# Patient Record
Sex: Male | Born: 1960 | Race: White | Hispanic: No | Marital: Married | Smoking: Former smoker
Health system: Southern US, Community
[De-identification: ages and names within clinical notes are randomized; demographics above are authoritative.]

## PROBLEM LIST (undated history)

## (undated) DIAGNOSIS — T7840XA Allergy, unspecified, initial encounter: Secondary | ICD-10-CM

## (undated) DIAGNOSIS — M5126 Other intervertebral disc displacement, lumbar region: Secondary | ICD-10-CM

## (undated) DIAGNOSIS — M48061 Spinal stenosis, lumbar region without neurogenic claudication: Secondary | ICD-10-CM

## (undated) DIAGNOSIS — M545 Low back pain, unspecified: Secondary | ICD-10-CM

## (undated) DIAGNOSIS — K219 Gastro-esophageal reflux disease without esophagitis: Secondary | ICD-10-CM

## (undated) DIAGNOSIS — M4316 Spondylolisthesis, lumbar region: Secondary | ICD-10-CM

## (undated) DIAGNOSIS — E785 Hyperlipidemia, unspecified: Secondary | ICD-10-CM

## (undated) HISTORY — DX: Gastro-esophageal reflux disease without esophagitis: K21.9

## (undated) HISTORY — DX: Hyperlipidemia, unspecified: E78.5

## (undated) HISTORY — DX: Low back pain, unspecified: M54.50

## (undated) HISTORY — DX: Spinal stenosis, lumbar region without neurogenic claudication: M48.061

## (undated) HISTORY — DX: Low back pain: M54.5

## (undated) HISTORY — PX: SPINE SURGERY: SHX786

## (undated) HISTORY — DX: Allergy, unspecified, initial encounter: T78.40XA

## (undated) HISTORY — DX: Spondylolisthesis, lumbar region: M43.16

## (undated) HISTORY — PX: VASECTOMY: SHX75

## (undated) HISTORY — DX: Other intervertebral disc displacement, lumbar region: M51.26

---

## 2012-07-03 LAB — HM COLONOSCOPY

## 2014-03-10 LAB — LIPID PANEL
Cholesterol: 271 — AB (ref 0–200)
LDL Cholesterol: 169
Triglycerides: 287 — AB (ref 40–160)

## 2014-03-10 LAB — BASIC METABOLIC PANEL: Glucose: 103

## 2014-07-02 LAB — BASIC METABOLIC PANEL: Glucose: 102

## 2014-07-02 LAB — LIPID PANEL
Cholesterol: 186 (ref 0–200)
HDL: 42 (ref 35–70)
HDL: 42 (ref 35–70)
LDL Cholesterol: 111
LDL Cholesterol: 111
Triglycerides: 167 — AB (ref 40–160)
Triglycerides: 167 — AB (ref 40–160)

## 2014-07-02 LAB — HEMOGLOBIN A1C: Hemoglobin A1C: 5.6

## 2015-01-21 LAB — HEMOGLOBIN A1C: Hemoglobin A1C: 5.8

## 2015-01-21 LAB — LIPID PANEL
Cholesterol: 180 (ref 0–200)
HDL: 42 (ref 35–70)
LDL Cholesterol: 98
Triglycerides: 198 — AB (ref 40–160)

## 2015-01-21 LAB — TSH: TSH: 1.71 (ref 0.41–5.90)

## 2016-10-09 ENCOUNTER — Ambulatory Visit: Payer: Self-pay | Admitting: Family Medicine

## 2016-10-10 ENCOUNTER — Ambulatory Visit: Payer: Self-pay | Admitting: Family Medicine

## 2016-10-19 ENCOUNTER — Encounter: Payer: Self-pay | Admitting: Family Medicine

## 2016-10-19 LAB — CHG ASSAY OF BLOOD LIPOPROTEIN,VLDL CHOLEST
VLDL Cholesterol Cal: 33
VLDL Cholesterol Cal: 33
VLDL Cholesterol Cal: 40

## 2016-10-19 LAB — VITAMIN D 25 HYDROXY (VIT D DEFICIENCY, FRACTURES)
Vitamin D, 25-OH, Total: 20.9
Vitamin D, 25-OH, Total: 30.3

## 2016-10-31 ENCOUNTER — Encounter: Payer: Self-pay | Admitting: Family Medicine

## 2016-10-31 ENCOUNTER — Ambulatory Visit (INDEPENDENT_AMBULATORY_CARE_PROVIDER_SITE_OTHER): Payer: BLUE CROSS/BLUE SHIELD | Admitting: Family Medicine

## 2016-10-31 VITALS — BP 136/98 | HR 80 | Temp 98.0°F | Ht 66.25 in | Wt 169.2 lb

## 2016-10-31 DIAGNOSIS — R030 Elevated blood-pressure reading, without diagnosis of hypertension: Secondary | ICD-10-CM | POA: Diagnosis not present

## 2016-10-31 DIAGNOSIS — F17200 Nicotine dependence, unspecified, uncomplicated: Secondary | ICD-10-CM

## 2016-10-31 DIAGNOSIS — K219 Gastro-esophageal reflux disease without esophagitis: Secondary | ICD-10-CM | POA: Diagnosis not present

## 2016-10-31 DIAGNOSIS — E785 Hyperlipidemia, unspecified: Secondary | ICD-10-CM | POA: Diagnosis not present

## 2016-10-31 DIAGNOSIS — J301 Allergic rhinitis due to pollen: Secondary | ICD-10-CM

## 2016-10-31 DIAGNOSIS — M545 Low back pain, unspecified: Secondary | ICD-10-CM | POA: Insufficient documentation

## 2016-10-31 DIAGNOSIS — Z860100 Personal history of colon polyps, unspecified: Secondary | ICD-10-CM | POA: Insufficient documentation

## 2016-10-31 DIAGNOSIS — E559 Vitamin D deficiency, unspecified: Secondary | ICD-10-CM | POA: Diagnosis not present

## 2016-10-31 DIAGNOSIS — G8929 Other chronic pain: Secondary | ICD-10-CM

## 2016-10-31 DIAGNOSIS — Z87891 Personal history of nicotine dependence: Secondary | ICD-10-CM | POA: Insufficient documentation

## 2016-10-31 DIAGNOSIS — J309 Allergic rhinitis, unspecified: Secondary | ICD-10-CM | POA: Insufficient documentation

## 2016-10-31 MED ORDER — VARENICLINE TARTRATE 0.5 MG PO TABS: 0.5000 mg | ORAL_TABLET | Freq: Two times a day (BID) | ORAL | 0 refills | Status: DC

## 2016-10-31 MED ORDER — PRAVASTATIN SODIUM 40 MG PO TABS: 40.0000 mg | ORAL_TABLET | Freq: Every day | ORAL | 3 refills | Status: DC

## 2016-10-31 MED ORDER — VARENICLINE TARTRATE 1 MG PO TABS: 1.0000 mg | ORAL_TABLET | Freq: Two times a day (BID) | ORAL | 5 refills | Status: DC

## 2016-10-31 NOTE — Assessment & Plan Note (Signed)
S: takes vitamin D 2000 units vitamin D a day. January 2017 was around [facility] A/P: we will update vitamin D with CPe

## 2016-10-31 NOTE — Assessment & Plan Note (Addendum)
Quit cold [place] in 20s for $100. Patient about 1 pack pre week. Has quit on chantix in past for 3 months. Wants to use 6 month course- very reasonable

## 2016-10-31 NOTE — Assessment & Plan Note (Signed)
S: patient takes OTC prilosec for help with GERD- seems to be effective and uses sparingly A/P: we discussed some of associations with PPI use and agreed prn use ideal

## 2016-10-31 NOTE — Assessment & Plan Note (Signed)
S: claritin in spring when mows lawn A/P: very reasonable,c ontinue prn use antihistamines

## 2016-10-31 NOTE — Assessment & Plan Note (Signed)
S: appears to have been controlled on last check on pravastatin 40mg . No myalgias.  Lab Results  Component Value Date   CHOL 180 01/21/2015   HDL 42 01/21/2015   LDLCALC 98 01/21/2015   TRIG 198 (A) 01/21/2015   A/P: will update lipids pre physical- ordered today

## 2016-10-31 NOTE — Patient Instructions (Addendum)
Blood pressure goal <140/90. High today BP Readings from Last 3 Encounters:  10/31/16 (!) 136/98  Dash eating plan can help- see below  For chantix-  Take one 0.5 mg tablet by mouth once daily for 3 days, then increase to one 0.5 mg tablet twice daily for 4 days, then increase to two 0.5 mg tablet twice daily. Quit smoking 1 week after starting  After that can use 5-6 more months of chantix.   Sign release of information at the check out desk for GI doctors for last colonoscopy and pathology report  Sign release of information at the check out desk for last 4 years of office notes from primary care doctor  Schedule a lab visit at the check out desk for 3 months from now, then see me for physical a few days later. Return for future fasting labs meaning nothing but water after midnight please. Ok to take your medications with water.    DASH Eating Plan DASH stands for "Dietary Approaches to Stop Hypertension." The DASH eating plan is a healthy eating plan that has been shown to reduce high blood pressure (hypertension). It may also reduce your risk for type 2 diabetes, heart disease, and stroke. The DASH eating plan may also help with weight loss. What are tips for following this plan? General guidelines  Avoid eating more than 2,300 mg (milligrams) of salt (sodium) a day. If you have hypertension, you may need to reduce your sodium intake to 1,500 mg a day.  Limit alcohol intake to no more than 1 drink a day for nonpregnant women and 2 drinks a day for men. One drink equals 12 oz of beer, 5 oz of wine, or 1 oz of hard liquor.  Work with your health care provider to maintain a healthy body weight or to lose weight. Ask what an ideal weight is for you.  Get at least 30 minutes of exercise that causes your heart to beat faster (aerobic exercise) most days of the week. Activities may include walking, swimming, or biking.  Work with your health care provider or diet and nutrition specialist  (dietitian) to adjust your eating plan to your individual calorie needs. Reading food labels  Check food labels for the amount of sodium per serving. Choose foods with less than 5 percent of the Daily Value of sodium. Generally, foods with less than 300 mg of sodium per serving fit into this eating plan.  To find whole grains, look for the word "whole" as the first word in the ingredient list. Shopping  Buy products labeled as "low-sodium" or "no salt added."  Buy fresh foods. Avoid canned foods and premade or frozen meals. Cooking  Avoid adding salt when cooking. Use salt-free seasonings or herbs instead of table salt or sea salt. Check with your health care provider or pharmacist before using salt substitutes.  Do not fry foods. Cook foods using healthy methods such as baking, boiling, grilling, and broiling instead.  Cook with heart-healthy oils, such as olive, canola, soybean, or sunflower oil. Meal planning   Eat a balanced diet that includes: ? 5 or more servings of fruits and vegetables each day. At each meal, try to fill half of your plate with fruits and vegetables. ? Up to 6-8 servings of whole grains each day. ? Less than 6 oz of lean meat, poultry, or fish each day. A 3-oz serving of meat is about the same size as a deck of cards. One egg equals 1 oz. ? 2  servings of low-fat dairy each day. ? A serving of nuts, seeds, or beans 5 times each week. ? Heart-healthy fats. Healthy fats called Omega-3 fatty acids are found in foods such as flaxseeds and coldwater fish, like sardines, salmon, and mackerel.  Limit how much you eat of the following: ? Canned or prepackaged foods. ? Food that is high in trans fat, such as fried foods. ? Food that is high in saturated fat, such as fatty meat. ? Sweets, desserts, sugary drinks, and other foods with added sugar. ? Full-fat dairy products.  Do not salt foods before eating.  Try to eat at least 2 vegetarian meals each week.  Eat  more home-cooked food and less restaurant, buffet, and fast food.  When eating at a restaurant, ask that your food be prepared with less salt or no salt, if possible. What foods are recommended? The items listed may not be a complete list. Talk with your dietitian about what dietary choices are best for you. Grains Whole-grain or whole-wheat bread. Whole-grain or whole-wheat pasta. Brown rice. [place]. Bulgur. Whole-grain and low-sodium cereals. Pita bread. Low-fat, low-sodium crackers. Whole-wheat flour tortillas. Vegetables Fresh or frozen vegetables (raw, steamed, roasted, or grilled). Low-sodium or reduced-sodium tomato and vegetable juice. Low-sodium or reduced-sodium tomato sauce and tomato paste. Low-sodium or reduced-sodium canned vegetables. Fruits All fresh, dried, or frozen fruit. Canned fruit in natural juice (without added sugar). Meat and other protein foods Skinless chicken or [place]. Ground chicken or [place]. Pork with fat trimmed off. Fish and seafood. Egg whites. Dried beans, peas, or lentils. Unsalted nuts, nut butters, and seeds. Unsalted canned beans. Lean cuts of beef with fat trimmed off. Low-sodium, lean deli meat. Dairy Low-fat (1%) or fat-free (skim) milk. Fat-free, low-fat, or reduced-fat cheeses. Nonfat, low-sodium ricotta or cottage cheese. Low-fat or nonfat yogurt. Low-fat, low-sodium cheese. Fats and oils Soft margarine without trans fats. Vegetable oil. Low-fat, reduced-fat, or light mayonnaise and salad dressings (reduced-sodium). Canola, safflower, olive, soybean, and sunflower oils. Avocado. Seasoning and other foods Herbs. Spices. Seasoning mixes without salt. Unsalted popcorn and pretzels. Fat-free sweets. What foods are not recommended? The items listed may not be a complete list. Talk with your dietitian about what dietary choices are best for you. Grains Baked goods made with fat, such as croissants, muffins, or some breads. Dry pasta or rice meal  packs. Vegetables Creamed or fried vegetables. Vegetables in a cheese sauce. Regular canned vegetables (not low-sodium or reduced-sodium). Regular canned tomato sauce and paste (not low-sodium or reduced-sodium). Regular tomato and vegetable juice (not low-sodium or reduced-sodium). Angie Fava. Olives. Fruits Canned fruit in a light or heavy syrup. Fried fruit. Fruit in cream or butter sauce. Meat and other protein foods Fatty cuts of meat. Ribs. Fried meat. Berniece Salines. Sausage. Bologna and other processed lunch meats. Salami. Fatback. Hotdogs. Bratwurst. Salted nuts and seeds. Canned beans with added salt. Canned or smoked fish. Whole eggs or egg yolks. Chicken or [place] with skin. Dairy Whole or 2% milk, cream, and half-and-half. Whole or full-fat cream cheese. Whole-fat or sweetened yogurt. Full-fat cheese. Nondairy creamers. Whipped toppings. Processed cheese and cheese spreads. Fats and oils Butter. Stick margarine. Lard. Shortening. Ghee. Bacon fat. Tropical oils, such as coconut, palm kernel, or palm oil. Seasoning and other foods Salted popcorn and pretzels. Onion salt, garlic salt, seasoned salt, table salt, and sea salt. Worcestershire sauce. Tartar sauce. Barbecue sauce. Teriyaki sauce. Soy sauce, including reduced-sodium. Steak sauce. Canned and packaged gravies. Fish sauce. Oyster sauce. Cocktail sauce. Horseradish  that you find on the shelf. Ketchup. Mustard. Meat flavorings and tenderizers. Bouillon cubes. Hot sauce and Tabasco sauce. Premade or packaged marinades. Premade or packaged taco seasonings. Relishes. Regular salad dressings. Where to find more information:  National Heart, Lung, and [place]: https://wilson-eaton.com/  American Heart Association: www.heart.org Summary  The DASH eating plan is a healthy eating plan that has been shown to reduce high blood pressure (hypertension). It may also reduce your risk for type 2 diabetes, heart disease, and stroke.  With the DASH eating  plan, you should limit salt (sodium) intake to 2,300 mg a day. If you have hypertension, you may need to reduce your sodium intake to 1,500 mg a day.  When on the DASH eating plan, aim to eat more fresh fruits and vegetables, whole grains, lean proteins, low-fat dairy, and heart-healthy fats.  Work with your health care provider or diet and nutrition specialist (dietitian) to adjust your eating plan to your individual calorie needs. This information is not intended to replace advice given to you by your health care provider. Make sure you discuss any questions you have with your health care provider. Document Released: 12/07/2010 Document Revised: 12/12/2015 Document Reviewed: 12/12/2015 Elsevier Interactive Patient Education  2017 [employer].

## 2016-10-31 NOTE — Assessment & Plan Note (Signed)
S: left low back into left leg. slipped disc he believes at L3-L4. Prolonged issues. exercise like yoga helps A/P: continue to monitor- he wants to manage this with exercise. Declines it being bad enough to pursue injections

## 2016-10-31 NOTE — Assessment & Plan Note (Signed)
Likely adenoma as was told 5 year follow up from 2014. Due 2019. Get records.

## 2016-10-31 NOTE — Progress Notes (Signed)
Phone: [phone]  Subjective:  Patient presents today to establish care.  Prior patient in [place]- obtain records. Chief complaint-noted.   See problem oriented charting  The following were reviewed and entered/updated in epic: Past Medical History:  Diagnosis Date  . GERD (gastroesophageal reflux disease)    prilosec otc sparingly  . Hyperlipidemia    pravastatin 40mg   . Low back pain    left low back into left leg. slipped disc he believes at L3-L4   Patient Active Problem List   Diagnosis Date Noted  . Smoker 10/31/2016    Priority: High  . Low back pain     Priority: Medium  . Hyperlipidemia     Priority: Medium  . Vitamin D deficiency 10/31/2016    Priority: Low  . Allergic rhinitis 10/31/2016    Priority: Low  . GERD (gastroesophageal reflux disease)     Priority: Low  . History of colon polyps 10/31/2016   Past Surgical History:  Procedure Laterality Date  . VASECTOMY      Family History  Problem Relation Age of Onset  . Dementia Mother        60 in 2018- at friends home  . Hypertension Mother   . Heart disease Father        MI at 53 and killed him. didnt get medical care, cigar smoker  . Healthy Sister   . Healthy Daughter   . Healthy Son   . Cancer Maternal Grandmother        unknown  . Bone cancer Paternal Grandfather        unknown original source    Medications- reviewed and updated Current Outpatient Prescriptions  Medication Sig Dispense Refill  . Glucosamine HCl (GLUCOSAMINE MAXIMUM STRENGTH PO) Take 2,000 Units by mouth daily.    Marland Kitchen omeprazole (PRILOSEC) 20 MG capsule Take 1 capsule by mouth daily as needed.  2  . pravastatin (PRAVACHOL) 40 MG tablet Take 1 tablet (40 mg total) by mouth daily. 90 tablet 3  . Vitamin D, Ergocalciferol, 2000 units CAPS Take 1 capsule by mouth daily.    . varenicline (CHANTIX) 0.5 MG tablet Take 1-2 tablets (0.5-1 mg total) by mouth 2 (two) times daily. See titration on after visit summary. Prefer  starter pack if covered. 120 tablet 0  . varenicline (CHANTIX) 1 MG tablet Take 1 tablet (1 mg total) by mouth 2 (two) times daily. Start after 0.5 tablets run out 60 tablet 5   No current facility-administered medications for this visit.     Allergies-reviewed and updated Allergies  Allergen Reactions  . Penicillins Rash    Social History   Social History  . Marital status: Married    Spouse name: N/A  . Number of children: N/A  . Years of education: N/A   Social History Main Topics  . Smoking status: Current Every Day Smoker    Packs/day: 0.25    Years: 35.00    Types: Cigarettes  . Smokeless tobacco: Never Used  . Alcohol use Yes     Comment: 4-5 per week  . Drug use: No  . Sexual activity: Yes   Other Topics Concern  . None   Social History Narrative   Married 1989. 2 children- 31 son Corporate treasurer and 21 daughter at [place] state- in [place] now.    Moved from [place] 2017.       [employer] account sales- Special educational needs teacher      Hobbies: golf with  Dr. Raliegh Ip, biking, walking trails, yoga    ROS--Full ROS was completed Review of Systems  Constitutional: Negative for chills and fever.  HENT: Negative for hearing loss and tinnitus.   Eyes: Negative for blurred vision.  Respiratory: Negative for cough and shortness of breath.   Cardiovascular: Negative for chest pain and palpitations.  Gastrointestinal: Negative for heartburn and nausea.  Genitourinary: Negative for dysuria and urgency.  Musculoskeletal: Positive for back pain and joint pain (low back pain with sciatica into left). Negative for falls.  Skin: Negative for itching and rash.  Neurological: Negative for dizziness and headaches.  Endo/Heme/Allergies: Negative for polydipsia. Does not bruise/bleed easily.  Psychiatric/Behavioral: Negative for hallucinations, substance abuse and suicidal ideas.   Objective: BP (!) 136/98   Pulse 80   Temp 98 F (36.7  C) (Oral)   Ht 5' 6.25" (1.683 m)   Wt 169 lb 3.2 oz (76.7 kg)   SpO2 96%   BMI 27.10 kg/m  Gen: NAD, resting comfortably HEENT: Mucous membranes are moist. Oropharynx normal. TM normal. Eyes: sclera and lids normal, PERRLA Neck: no thyromegaly, no cervical lymphadenopathy CV: RRR no murmurs rubs or gallops Lungs: CTAB no crackles, wheeze, rhonchi Abdomen: soft/nontender/nondistended/normal bowel sounds. No rebound or guarding.  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 Minor so agrees to use comfortable shoes or inserts to give support and see [place] back if not improving  Elevated blood pressure reading S: controlled in past on no medicine.  BP Readings from Last 3 Encounters:  10/31/16 (!) 136/98  A/P: We discussed blood pressure goal of <140/90. Will get records to see If prior elevations. Discussed dash eating plan and 3 month repeat. Would consider home monitoring as well before starting meds if remains hih at follow up  Hyperlipidemia S: appears to have been controlled on last check on pravastatin 40mg . No myalgias.  Lab Results  Component Value Date   CHOL 180 01/21/2015   HDL 42 01/21/2015   LDLCALC 98 01/21/2015   TRIG 198 (A) 01/21/2015   A/P: will update lipids pre physical- ordered today  GERD (gastroesophageal reflux disease) S: patient takes OTC prilosec for help with GERD- seems to be effective and uses sparingly A/P: we discussed some of associations with PPI use and agreed prn use ideal  Vitamin D deficiency S: takes vitamin D 2000 units vitamin D a day. January 2017 was around [facility] A/P: we will update vitamin D with CPe   Allergic rhinitis S: claritin in spring when mows lawn A/P: very reasonable,c ontinue prn use antihistamines  Low back pain S: left low back  into left leg. slipped disc he believes at L3-L4. Prolonged issues. exercise like yoga helps A/P: continue to monitor- he wants to manage this with exercise. Declines it being bad enough to pursue injections  Smoker Quit cold [place] in 20s for $100. Patient about 1 pack pre week. Has quit on chantix in past for 3 months. Wants to use 6 month course- very reasonable  History of colon polyps Likely adenoma as was told 5 year follow up from 2014. Due 2019. Get records.   Future fasting labs then CPE a few days after Orders Placed This Encounter  Procedures  . CBC    Standing Status:   Future    Standing Expiration Date:   10/31/2017  .  Comprehensive metabolic panel    [place]    Standing Status:   Future    Standing Expiration Date:   10/31/2017  . Lipid panel    Standing Status:   Future    Standing Expiration Date:   10/31/2017  . VITAMIN D 25 Hydroxy (Vit-D Deficiency, Fractures)    Standing Status:   Future    Standing Expiration Date:   10/31/2017  . Urinalysis    Standing Status:   Future    Standing Expiration Date:   10/31/2017    Meds ordered this encounter  Medications  . omeprazole (PRILOSEC) 20 MG capsule    Sig: Take 1 capsule by mouth daily as needed.    Refill:  2  . DISCONTD: pravastatin (PRAVACHOL) 40 MG tablet    Sig: Take 40 mg by mouth daily.  . Glucosamine HCl (GLUCOSAMINE MAXIMUM STRENGTH PO)    Sig: Take 2,000 Units by mouth daily.  . Vitamin D, Ergocalciferol, 2000 units CAPS    Sig: Take 1 capsule by mouth daily.  . pravastatin (PRAVACHOL) 40 MG tablet    Sig: Take 1 tablet (40 mg total) by mouth daily.    Dispense:  90 tablet    Refill:  3  . varenicline (CHANTIX) 0.5 MG tablet    Sig: Take 1-2 tablets (0.5-1 mg total) by mouth 2 (two) times daily. See titration on after visit summary. Prefer starter pack if covered.    Dispense:  120 tablet    Refill:  0  . varenicline (CHANTIX) 1 MG tablet    Sig: Take 1 tablet (1 mg total) by mouth 2 (two)  times daily. Start after 0.5 tablets run out    Dispense:  60 tablet    Refill:  5    Return precautions advised. Garret Reddish, MD

## 2016-11-05 ENCOUNTER — Encounter: Payer: Self-pay | Admitting: Family Medicine

## 2016-11-05 DIAGNOSIS — R739 Hyperglycemia, unspecified: Secondary | ICD-10-CM | POA: Insufficient documentation

## 2016-11-08 ENCOUNTER — Telehealth: Payer: Self-pay

## 2016-11-08 NOTE — Telephone Encounter (Signed)
Faxed ROI authorization to [employer]

## 2017-01-31 ENCOUNTER — Other Ambulatory Visit: Payer: BLUE CROSS/BLUE SHIELD

## 2017-02-24 ENCOUNTER — Other Ambulatory Visit: Payer: Self-pay | Admitting: Family Medicine

## 2017-04-01 ENCOUNTER — Other Ambulatory Visit (INDEPENDENT_AMBULATORY_CARE_PROVIDER_SITE_OTHER): Payer: BLUE CROSS/BLUE SHIELD

## 2017-04-01 DIAGNOSIS — E785 Hyperlipidemia, unspecified: Secondary | ICD-10-CM

## 2017-04-01 DIAGNOSIS — F17200 Nicotine dependence, unspecified, uncomplicated: Secondary | ICD-10-CM

## 2017-04-01 DIAGNOSIS — E559 Vitamin D deficiency, unspecified: Secondary | ICD-10-CM | POA: Diagnosis not present

## 2017-04-01 LAB — COMPREHENSIVE METABOLIC PANEL
ALT: 18 U/L (ref 0–53)
AST: 18 U/L (ref 0–37)
Albumin: 4.1 g/dL (ref 3.5–5.2)
Alkaline Phosphatase: 53 U/L (ref 39–117)
BUN: 17 mg/dL (ref 6–23)
CO2: 28 mEq/L (ref 19–32)
Calcium: 9.1 mg/dL (ref 8.4–10.5)
Chloride: 106 mEq/L (ref 96–112)
Creatinine, Ser: 0.95 mg/dL (ref 0.40–1.50)
GFR: 87.02 mL/min (ref >60.00)
Glucose, Bld: 129 mg/dL — ABNORMAL HIGH (ref 70–99)
Potassium: 4.4 mEq/L (ref 3.5–5.1)
Sodium: 140 mEq/L (ref 135–145)
Total Bilirubin: 0.7 mg/dL (ref 0.2–1.2)
Total Protein: 7 g/dL (ref 6.0–8.3)

## 2017-04-01 LAB — URINALYSIS
Bilirubin Urine: NEGATIVE
Hgb urine dipstick: NEGATIVE
Ketones, ur: NEGATIVE
Leukocytes, UA: NEGATIVE
Nitrite: NEGATIVE
Specific Gravity, Urine: >=1.03 — AB (ref 1.000–1.030)
Total Protein, Urine: NEGATIVE
Urine Glucose: NEGATIVE
Urobilinogen, UA: 0.2 (ref 0.0–1.0)
pH: 6 (ref 5.0–8.0)

## 2017-04-01 LAB — LIPID PANEL
Cholesterol: 153 mg/dL (ref 0–200)
HDL: 39.7 mg/dL (ref >39.00)
LDL Cholesterol: 84 mg/dL (ref 0–99)
NonHDL: 112.89
Total CHOL/HDL Ratio: 4
Triglycerides: 142 mg/dL (ref 0.0–149.0)
VLDL: 28.4 mg/dL (ref 0.0–40.0)

## 2017-04-01 LAB — CBC
HCT: 45.2 % (ref 39.0–52.0)
Hemoglobin: 15.3 g/dL (ref 13.0–17.0)
MCHC: 33.9 g/dL (ref 30.0–36.0)
MCV: 89.4 fl (ref 78.0–100.0)
Platelets: 222 10*3/uL (ref 150.0–400.0)
RBC: 5.06 Mil/uL (ref 4.22–5.81)
RDW: 13.3 % (ref 11.5–15.5)
WBC: 5.2 10*3/uL (ref 4.0–10.5)

## 2017-04-01 LAB — VITAMIN D 25 HYDROXY (VIT D DEFICIENCY, FRACTURES): VITD: 33.18 ng/mL (ref 30.00–100.00)

## 2017-04-08 ENCOUNTER — Ambulatory Visit (INDEPENDENT_AMBULATORY_CARE_PROVIDER_SITE_OTHER): Payer: BLUE CROSS/BLUE SHIELD | Admitting: Family Medicine

## 2017-04-08 ENCOUNTER — Encounter: Payer: Self-pay | Admitting: Family Medicine

## 2017-04-08 VITALS — BP 126/82 | HR 71 | Temp 98.7°F | Ht 66.25 in | Wt 168.4 lb

## 2017-04-08 DIAGNOSIS — Z125 Encounter for screening for malignant neoplasm of prostate: Secondary | ICD-10-CM

## 2017-04-08 DIAGNOSIS — Z0000 Encounter for general adult medical examination without abnormal findings: Secondary | ICD-10-CM | POA: Diagnosis not present

## 2017-04-08 DIAGNOSIS — R739 Hyperglycemia, unspecified: Secondary | ICD-10-CM | POA: Diagnosis not present

## 2017-04-08 DIAGNOSIS — Z23 Encounter for immunization: Secondary | ICD-10-CM

## 2017-04-08 DIAGNOSIS — Z86010 Personal history of colonic polyps: Secondary | ICD-10-CM | POA: Diagnosis not present

## 2017-04-08 LAB — HEMOGLOBIN A1C: Hgb A1c MFr Bld: 5.7 % (ref 4.6–6.5)

## 2017-04-08 LAB — PSA: PSA: 1.02 ng/mL (ref 0.10–4.00)

## 2017-04-08 NOTE — Progress Notes (Signed)
Phone: [phone]  Subjective:  Patient presents today for their annual physical. Chief complaint-noted.   See problem oriented charting- ROS- full  review of systems was completed and negative except for: rectal pain from hemorrhoid   The following were reviewed and entered/updated in epic: Past Medical History:  Diagnosis Date  . GERD (gastroesophageal reflux disease)    prilosec otc sparingly  . Hyperlipidemia    pravastatin 40mg   . Low back pain    left low back into left leg. slipped disc he believes at L3-L4   Patient Active Problem List   Diagnosis Date Noted  . Smoker 10/31/2016    Priority: High  . Low back pain     Priority: Medium  . Hyperlipidemia     Priority: Medium  . Vitamin D deficiency 10/31/2016    Priority: Low  . Allergic rhinitis 10/31/2016    Priority: Low  . GERD (gastroesophageal reflux disease)     Priority: Low  . Hyperglycemia 11/05/2016  . History of colon polyps 10/31/2016   Past Surgical History:  Procedure Laterality Date  . VASECTOMY      Family History  Problem Relation Age of Onset  . Dementia Mother        48 in 2018- at friends home  . Hypertension Mother   . Heart disease Father        MI at 31 and killed him. didnt get medical care, cigar smoker  . Healthy Sister   . Healthy Daughter   . Healthy Son   . Cancer Maternal Grandmother        unknown  . Bone cancer Paternal Grandfather        unknown original source    Medications- reviewed and updated Current Outpatient Medications  Medication Sig Dispense Refill  . pravastatin (PRAVACHOL) 40 MG tablet Take 1 tablet (40 mg total) by mouth daily. 90 tablet 3  . Vitamin D, Ergocalciferol, 2000 units CAPS Take 1 capsule by mouth daily.     No current facility-administered medications for this visit.     Allergies-reviewed and updated Allergies  Allergen Reactions  . Penicillins Rash    Social History   Social History Narrative   Married 1989. 2 children- 6  son Corporate treasurer and 21 daughter at [place] state- in [place] now.    Moved from [place] 2017.       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 Raliegh Ip, biking, walking trails, yoga   Objective: BP 126/82 (BP Location: Left Arm, Patient Position: Sitting, Cuff Size: Large)   Pulse 71   Temp 98.7 F (37.1 C) (Oral)   Ht 5' 6.25" (1.683 m)   Wt 168 lb 6.4 oz (76.4 kg)   SpO2 97%   BMI 26.98 kg/m  Gen: NAD, resting comfortably HEENT: Mucous membranes are moist. Oropharynx normal Neck: no thyromegaly CV: RRR no murmurs rubs or gallops Lungs: CTAB no crackles, wheeze, rhonchi Abdomen: soft/nontender/nondistended/normal bowel sounds. No rebound or guarding.  Ext: no edema Skin: warm, dry Neuro: grossly normal, moves all extremities, PERRLA Rectal: normal tone, diffusely enlarged prostate, no masses or tenderness   Assessment/Plan:  57 y.o. male presenting for annual physical.  Health Maintenance counseling: 1. Anticipatory guidance: Patient counseled regarding regular dental exams -q6 months, eye exams -yearly, wearing seatbelts.  2. Risk factor reduction:  Advised patient of need for regular exercise and diet rich and fruits and vegetables to reduce risk  of heart attack and stroke. He cut down on sugar and carbs and lost 6 lbs on home scales. Counting calories. 3-4 days a week exercising- walk/run (walk up hills) 3. Immunizations/screenings/ancillary studies- discussed shingrix. Tdap today.  4. Prostate cancer screening- will get PSA today. Rectal exam - with BPH and nocturia 2x a night- will take this in mind for PSA and likely trend yearly  5. Colon cancer screening -  was told 2019 for colonoscopy but it was hyperplastic when looking at report. I will still refer as want extension to 10 years to be determined by GI as GI originally said 5 year- though [place] and [place] our provider.  6. Skin cancer  screening- no dermatologist- waist up exam reassuring. advised regular sunscreen use. Denies worrisome, changing, or new skin lesions.  7. Former Smoker- 6 month course of chantix last year. Only used 1.5 months and quit! Congratulated. AAA plan at 57  Status of chronic or acute concerns   Hyperglycemia- CBG 129 with CPE labs. Will get a1c. Eating clean and exercising- hopeful the CBG is not consistent with a1c and not at high risk or in diabetes range.   BP better today than last visit  Hyperlipidemia- very well controlled on pravastatin 40mg  with LDL of 84  GERD- OTC prilosec prn- he uses sparingly. hasnt had to use in months- cleaned up diet.   Vitamin d deficiency- on 2000 units a day. level looks great at >30 ng/ml  Prn antihistamines in spring with mowing lawn  Hemorrhoid noted on rectal exam- he is using preparation H and helping  Lab/Order associations: Preventative health care - Plan: Hemoglobin A1c, PSA  Hyperglycemia - Plan: POCT glycosylated hemoglobin (Hb A1C), Hemoglobin A1c  Need for prophylactic vaccination with combined diphtheria-tetanus-pertussis (DTP) vaccine - Plan: Tdap vaccine greater than or equal to 7yo IM  Screening for prostate cancer - Plan: PSA  History of colon polyps - Plan: Ambulatory referral to Gastroenterology Return precautions advised.  Garret Reddish, MD

## 2017-04-08 NOTE — Patient Instructions (Addendum)
Please stop by lab before you go  No changes in medicine  I hope your a1c (3 month sugar check) tells a different story than your 1 day check for blood sugar. If you are in diabetes range- would likely have you come back in to talk about management/treatment options.   We will call you within a week or two about your referral to GI for possible colonoscopy (they will let you know if you dont need one). If you do not hear within 3 weeks, give [place] a call.   Tdap today

## 2017-07-10 ENCOUNTER — Encounter: Payer: Self-pay | Admitting: Family Medicine

## 2017-07-12 ENCOUNTER — Encounter: Payer: Self-pay | Admitting: Family Medicine

## 2017-07-12 ENCOUNTER — Ambulatory Visit: Payer: BLUE CROSS/BLUE SHIELD | Admitting: Family Medicine

## 2017-07-12 VITALS — BP 116/76 | HR 75 | Temp 98.5°F | Ht 66.25 in | Wt 167.2 lb

## 2017-07-12 DIAGNOSIS — M5416 Radiculopathy, lumbar region: Secondary | ICD-10-CM | POA: Diagnosis not present

## 2017-07-12 MED ORDER — PREDNISONE 50 MG PO TABS: 50.0000 mg | ORAL_TABLET | Freq: Every day | ORAL | 0 refills | Status: DC

## 2017-07-12 NOTE — Patient Instructions (Addendum)
Start prednisone for 7 days.   This sounds like an irritated nerve root.   Please update me through [facility] or phone call on Tuesday or Wednesday- if not making significant progress I want to get you in with Dr. Paulla Fore our sports medicine specialist  Seek care immediately if worsening weakness, incontinence of stool or urine

## 2017-07-12 NOTE — Progress Notes (Signed)
Subjective:  Curtis Butler is a 57 y.o. year old very pleasant male patient who presents for/with See problem oriented charting ROS- see ros below   Past Medical History-  Patient Active Problem List   Diagnosis Date Noted  . Smoker 10/31/2016    Priority: High  . Low back pain     Priority: Medium  . Hyperlipidemia     Priority: Medium  . Vitamin D deficiency 10/31/2016    Priority: Low  . Allergic rhinitis 10/31/2016    Priority: Low  . GERD (gastroesophageal reflux disease)     Priority: Low  . Hyperglycemia 11/05/2016  . History of colon polyps 10/31/2016    Medications- reviewed and updated Current Outpatient Medications  Medication Sig Dispense Refill  . pravastatin (PRAVACHOL) 40 MG tablet Take 1 tablet (40 mg total) by mouth daily. 90 tablet 3  . predniSONE (DELTASONE) 50 MG tablet Take 1 tablet (50 mg total) by mouth daily with breakfast. 7 tablet 0  . Vitamin D, Ergocalciferol, 2000 units CAPS Take 1 capsule by mouth daily.     No current facility-administered medications for this visit.    Objective: BP 116/76 (BP Location: Left Arm, Patient Position: Sitting, Cuff Size: Normal)   Pulse 75   Temp 98.5 F (36.9 C) (Oral)   Ht 5' 6.25" (1.683 m)   Wt 167 lb 3.2 oz (75.8 kg)   SpO2 95%   BMI 26.78 kg/m  Gen: NAD, resting comfortably CV: RRR no murmurs rubs or gallops Lungs: CTAB no crackles, wheeze, rhonchi Abdomen: soft/nontender/nondistended Ext: no edema Skin: warm, dry  Back - Normal skin, Spine with normal alignment and no deformity.  No tenderness to vertebral process palpation.  Paraspinous muscles are not tender and without spasm.   Range of motion is full at neck and lumbar sacral regions. Negative Straight leg raise.  Neuro- no saddle anesthesia, 5-/5 strength left lower extremity (he states this is baseline and left always weaker), right side 5/5 , 2+ reflexes  Assessment/Plan:  Lumbar back pain with radiculopathy affecting left lower  extremity S: has had issues for a long time with back pain dating back to teenage years. Often if stood for too long felt like he needed to stretch it out. For last 5-6 weeks has noted pain into left buttocks now as well into left leg. On a flight 2 days ago went down the way into the foot. Gets numbness/tingling into the leg. Has tried stretching, rolling, massage. No real relief with this other than massage at immediate time. logn term slipped disc L3-l4 he thinks. Feels weak with standing but no falls. Had to stop his regular walking  Ibuprofen doesn't help much. Pain a few weekends ago got up 10/10 at its worse. Today, pain 7/10. Worse with sitting. Never taken prednisone in the past.   Unfortunately he has some sitting coming up.  Driving [place]- driving back Monday. Driving to [facility] Tuesday.   ROS-No saddle anesthesia, bladder incontinence, fecal incontinence, weakness in extremity, numbness or tingling in extremity. History negative for trauma, history of cancer, fever, chills, unintentional weight loss, recent bacterial infection, recent IV drug use, HIV, pain worse at night or while supine.  A/P: Lumbar back pain with radiculopathy affecting left lower extremity worse with sitting- suspect bulging disc From avs "Start prednisone for 7 days.   This sounds like an irritated nerve root.   Please update me through [facility] or phone call on Tuesday or Wednesday- if not making significant progress I  want to get you in with Dr. Paulla Fore our sports medicine specialist  Seek care immediately if worsening weakness, incontinence of stool or urine" Patient wants to avoid surgery if possible and be aggressive with other efforts  Meds ordered this encounter  Medications  . predniSONE (DELTASONE) 50 MG tablet    Sig: Take 1 tablet (50 mg total) by mouth daily with breakfast.    Dispense:  7 tablet    Refill:  0   Return precautions advised.  Garret Reddish, MD

## 2017-07-17 ENCOUNTER — Encounter: Payer: Self-pay | Admitting: Family Medicine

## 2017-07-28 ENCOUNTER — Encounter: Payer: Self-pay | Admitting: Family Medicine

## 2017-07-29 ENCOUNTER — Ambulatory Visit (INDEPENDENT_AMBULATORY_CARE_PROVIDER_SITE_OTHER): Payer: BLUE CROSS/BLUE SHIELD

## 2017-07-29 ENCOUNTER — Ambulatory Visit: Payer: BLUE CROSS/BLUE SHIELD | Admitting: Sports Medicine

## 2017-07-29 ENCOUNTER — Other Ambulatory Visit: Payer: Self-pay

## 2017-07-29 ENCOUNTER — Encounter: Payer: Self-pay | Admitting: Sports Medicine

## 2017-07-29 VITALS — BP 150/104 | HR 86 | Ht 66.25 in | Wt 170.6 lb

## 2017-07-29 DIAGNOSIS — M545 Low back pain: Secondary | ICD-10-CM | POA: Diagnosis not present

## 2017-07-29 DIAGNOSIS — G8929 Other chronic pain: Secondary | ICD-10-CM

## 2017-07-29 DIAGNOSIS — M5442 Lumbago with sciatica, left side: Secondary | ICD-10-CM

## 2017-07-29 MED ORDER — GABAPENTIN 300 MG PO CAPS: ORAL_CAPSULE | ORAL | 1 refills | Status: DC

## 2017-07-29 NOTE — Patient Instructions (Signed)
Please perform the exercise program that we have prepared for you and gone over in detail on a daily basis.  In addition to the handout you were provided you can access your program through: [email or web address]   Your unique program code is:    Also check out [employer]" which is a program developed by Dr. Minerva Ends.   There are links to a couple of his YouTube Videos below and I would like to see performing one of his videos 5-6 days per week.    A good intro video is: "Independence from Pain 7-minute Video" - [email or web address]   Exercises that focus more on the neck are as below: Dr. Archie Balboa with [facility] teaching neck and shoulder details Part 1 - https://[email or web address].be/[email or web address] Part 2 Dr. Archie Balboa with [facility] quick routine to practice daily - https://[email or web address].be/[email or web address]  Do not try to attempt the entire video when first beginning.    Try breaking of each exercise that he goes into shorter segments.  Otherwise if they perform an exercise for 45 seconds, start with 15 seconds and rest and then resume when they begin the new activity.  If you work your way up to being able to do these videos without having to stop, I expect you will see significant improvements in your pain.  If you enjoy his videos and would like to find out more you can look on his website: [email or web address]  He has a workout streaming option as well as a DVD set available for purchase.  Amazon has the best price for his DVDs.

## 2017-07-29 NOTE — Progress Notes (Signed)
Curtis Butler at [facility] - 57 y.o. male MRN [id number]  Date of birth: October 20, 1960  Visit Date: 07/29/2017  PCP: Marin Olp, MD   Referred by: Marin Olp, MD  Scribe(s) for today's visit: Wendy Poet, LAT, ATC  SUBJECTIVE:  Curtis Butler is here for New Patient (Initial Visit) (Low back pain w/ L LE pain) .    His low back pain and L LE symptoms INITIALLY: Began 6 weeks w/ no known MOI Described as moderate-severe depending on activity, radiating to L LE all the way to the foot Worsened with prolonged standing and walking as well as prolonged sitting (standing >sitting) Improved with stretching for lower back and legs Additional associated symptoms include: pain and N/T into L LE    At this time symptoms show no change compared to onset  He took prednisone 50 mg x 7 days which helped but now symptoms have returned to prior level.  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 Denies fevers, chills, or night sweats. Denies unexplained weight loss. Denies personal history of cancer. Denies changes in bowel or bladder habits. Denies recent unreported falls. Denies new or worsening dyspnea or wheezing. Denies headaches or dizziness.  Reports numbness, tingling or weakness  In the extremities - in the L LE Denies dizziness or presyncopal episodes Denies lower extremity edema    HISTORY & PERTINENT PRIOR DATA:  Prior History reviewed and updated per electronic medical record.  Significant/pertinent history, findings, studies include:  reports that he quit smoking about 9 months ago. His smoking use included cigarettes. He has a 8.75 pack-year smoking history. He has never used smokeless tobacco. Recent Labs    04/08/17 1407  HGBA1C 5.7   No specialty comments available. No problems  updated.  OBJECTIVE:  VS:  HT:5' 6.25" (168.3 cm)   WT:170 lb 9.6 oz (77.4 kg)  BMI:27.32    BP:(Abnormal) 150/104  HR:86bpm  TEMP: ( )  RESP:96 %   PHYSICAL EXAM: Constitutional: WDWN, Non-toxic appearing. Psychiatric: Alert & appropriately interactive.  Not depressed or anxious appearing. Respiratory: No increased work of breathing.  Trachea Midline Eyes: Pupils are equal.  EOM intact without nystagmus.  No scleral icterus  Vascular Exam: warm to touch no edema  lower extremity neuro exam: unremarkable normal strength normal sensation normal reflexes  MSK Exam: Bilateral negative straight leg raises.  He has good internal/external rotation of bilateral hips.  Negative Faber testing.  No significant pain with palpation of the greater sciatic notch or popliteal space.   PROCEDURES & DATA REVIEWED:  PROCEDURE NOTE: THERAPEUTIC EXERCISES (97110) 15 minutes spent for Therapeutic exercises as below and as referenced in the AVS.  This included exercises focusing on stretching, strengthening, with significant focus on eccentric aspects.   Proper technique shown and discussed handout in great detail with ATC.  All questions were discussed and answered.   Long term goals include an improvement in range of motion, strength, endurance as well as avoiding reinjury. Frequency of visits is one time as determined during today's  office visit. Frequency of exercises to be performed is as per handout.  EXERCISES REVIEWED:  Pelvic recruitment  Goodman Exercises  Hip ABduction strengthening with focus on Glute Medius Recruitment  ASSESSMENT & PLAN:   1. Chronic left-sided low back pain with left-sided sciatica  PLAN: Links to [employer] provided today per Patient Instructions.  These exercises were developed by Minerva Ends, DC with a strong emphasis on core neuromuscular reducation and postural realignment through body-weight exercises.  Discussed the  foundation of treatment for this condition is physical therapy and/or daily (5-6 days/week) therapeutic exercises, focusing on core strengthening, coordination, neuromuscular control/reeducation.  Therapeutic exercises prescribed per procedure note.  Low dose of gabapentin given the mild radicular nature of his symptoms  Follow-up: Return in about 6 weeks (around 09/09/2017).      Please see additional documentation for Objective, Assessment and Plan sections. Pertinent additional documentation may be included in corresponding procedure notes, imaging studies, problem based documentation and patient instructions. Please see these sections of the encounter for additional information regarding this visit.  CMA/ATC served as Education administrator during this visit. History, Physical, and Plan performed by medical provider. Documentation and orders reviewed and attested to.      Gerda Diss, [facility] Physician

## 2017-08-05 ENCOUNTER — Encounter: Payer: Self-pay | Admitting: Sports Medicine

## 2017-08-15 ENCOUNTER — Encounter: Payer: Self-pay | Admitting: Sports Medicine

## 2017-08-18 ENCOUNTER — Encounter: Payer: Self-pay | Admitting: Sports Medicine

## 2017-08-19 ENCOUNTER — Other Ambulatory Visit: Payer: Self-pay

## 2017-08-19 DIAGNOSIS — M5416 Radiculopathy, lumbar region: Secondary | ICD-10-CM

## 2017-09-03 ENCOUNTER — Encounter: Payer: Self-pay | Admitting: Sports Medicine

## 2017-09-03 NOTE — Telephone Encounter (Signed)
Please schedule a follow up for Monday afternoon

## 2017-09-06 ENCOUNTER — Ambulatory Visit
Admission: RE | Admit: 2017-09-06 | Discharge: 2017-09-06 | Disposition: A | Discharge status: Home / Self Care | Payer: BLUE CROSS/BLUE SHIELD | Source: Ambulatory Visit | Attending: Sports Medicine | Admitting: Sports Medicine

## 2017-09-06 DIAGNOSIS — M545 Low back pain: Secondary | ICD-10-CM | POA: Diagnosis not present

## 2017-09-06 DIAGNOSIS — M5416 Radiculopathy, lumbar region: Secondary | ICD-10-CM

## 2017-09-12 ENCOUNTER — Ambulatory Visit: Payer: BLUE CROSS/BLUE SHIELD | Admitting: Sports Medicine

## 2017-09-13 ENCOUNTER — Other Ambulatory Visit: Payer: Self-pay | Admitting: Physical Therapy

## 2017-09-13 DIAGNOSIS — M5416 Radiculopathy, lumbar region: Secondary | ICD-10-CM

## 2017-09-13 NOTE — Progress Notes (Signed)
Marked changes in his lumbar spine.  Referral for epidural steroid injection with Dr. Ernestina Patches recommended at this time.  See him back to go over these results in person if you would prefer but direct referral to Dr. Ernestina Patches is appropriate if the patient would like to do so.

## 2017-09-19 ENCOUNTER — Ambulatory Visit: Payer: BLUE CROSS/BLUE SHIELD | Admitting: Sports Medicine

## 2017-09-19 ENCOUNTER — Other Ambulatory Visit: Payer: Self-pay | Admitting: Family Medicine

## 2017-09-27 ENCOUNTER — Encounter (INDEPENDENT_AMBULATORY_CARE_PROVIDER_SITE_OTHER): Payer: Self-pay | Admitting: Physical Medicine and Rehabilitation

## 2017-09-27 ENCOUNTER — Ambulatory Visit (INDEPENDENT_AMBULATORY_CARE_PROVIDER_SITE_OTHER): Payer: BLUE CROSS/BLUE SHIELD | Admitting: Physical Medicine and Rehabilitation

## 2017-09-27 ENCOUNTER — Ambulatory Visit (INDEPENDENT_AMBULATORY_CARE_PROVIDER_SITE_OTHER): Payer: Self-pay

## 2017-09-27 VITALS — BP 139/97 | HR 83 | Temp 98.2°F

## 2017-09-27 DIAGNOSIS — Q762 Congenital spondylolisthesis: Secondary | ICD-10-CM | POA: Diagnosis not present

## 2017-09-27 DIAGNOSIS — M5116 Intervertebral disc disorders with radiculopathy, lumbar region: Secondary | ICD-10-CM

## 2017-09-27 DIAGNOSIS — M5416 Radiculopathy, lumbar region: Secondary | ICD-10-CM

## 2017-09-27 DIAGNOSIS — M4316 Spondylolisthesis, lumbar region: Secondary | ICD-10-CM | POA: Diagnosis not present

## 2017-09-27 MED ORDER — BETAMETHASONE SOD PHOS & ACET 6 (3-3) MG/ML IJ SUSP
12.0000 mg | Freq: Once | INTRAMUSCULAR | Status: AC
  Administered 2017-09-27: 12 mg

## 2017-09-27 NOTE — Progress Notes (Signed)
 .  Numeric Pain Rating Scale and Functional Assessment Average Pain 8   In the last MONTH (on 0-10 scale) has pain interfered with the following?  1. General activity like being  able to carry out your everyday physical activities such as walking, climbing stairs, carrying groceries, or moving a chair?  Rating(5)   +Driver, -BT, -Dye Allergies.  

## 2017-09-27 NOTE — Patient Instructions (Signed)

## 2017-10-02 ENCOUNTER — Other Ambulatory Visit: Payer: Self-pay | Admitting: *Deleted

## 2017-10-02 MED ORDER — GABAPENTIN 300 MG PO CAPS: 300.0000 mg | ORAL_CAPSULE | Freq: Three times a day (TID) | ORAL | 2 refills | Status: DC | PRN

## 2017-10-03 NOTE — Progress Notes (Signed)
Curtis Butler - 57 y.o. male MRN [id number]  Date of birth: 09/13/1960  Office Visit Note: Visit Date: 09/27/2017 PCP: Marin Olp, MD Referred by: Marin Olp, MD  Subjective: Chief Complaint  Patient presents with  . Left Leg - Pain, Numbness, Tingling   HPI: Curtis Butler is a 57 year old gentleman that comes in today at the request of Dr. Teresa Coombs and is also followed by his primary care physician Dr. Garret Reddish.  He has been having pain in the left buttocks and left leg with numbness and tingling that started at the end of June of this year.  No specific trauma or known issues that started the problem.  He has had a history of off-and-on back pain but nothing with radicular quality like this.  He had initial treatment with anti-inflammatory and prednisone with may be some mild relief.  He has a pretty extensive travel schedule at times and has not been helping the issue.  Prolonged sitting but also prolonged standing can increase the pain.  He does get some relief with stretching but this is temporary.  He does try to stretch the piriformis muscle.  Dr. Paulla Fore has performed some therapeutic exercise from an osteopathic standpoint.  He has been using gabapentin 300 mg 3 times a day with some mild relief.  He does get a lot of paresthesias in the left leg.  Part of his symptoms are consistent more with an L4 distribution but he also has this posterior buttock pain as well that could be more S1 related.  MRI of the lumbar spine was obtained by Dr. Paulla Fore but the patient has not seen this and has not been reviewed with him to great detail.  He has not had any focal weakness or bowel bladder difficulties.  He said no red flag complaints.  No fever chills or night sweats.  He has had no right-sided symptoms.  MRI reviewed below shows grade 1 listhesis of L4 on L5 with pars defects.  No central stenosis noted which is also further evidence that this is a pars defect issue.  He  has a left foraminal protrusion and disc herniation which would likely affect the L4 nerve root on the side.  He also has left paracentral disc at L5-S1 that could irritate either 1 of the 2 S1 nerve roots bilaterally.  He does have some mild facet arthropathy.   Review of Systems  Constitutional: Negative for chills, fever, malaise/fatigue and weight loss.  HENT: Negative for hearing loss and sinus pain.   Eyes: Negative for blurred vision, double vision and photophobia.  Respiratory: Negative for cough and shortness of breath.   Cardiovascular: Negative for chest pain, palpitations and leg swelling.  Gastrointestinal: Negative for abdominal pain, nausea and vomiting.  Genitourinary: Negative for flank pain.  Musculoskeletal: Positive for back pain. Negative for myalgias.       Left hip and leg pain  Skin: Negative for itching and rash.  Neurological: Positive for tingling. Negative for tremors, focal weakness and weakness.  Endo/Heme/Allergies: Negative.   Psychiatric/Behavioral: Negative for depression.  All other systems reviewed and are negative.  Otherwise per HPI.  Assessment & Plan: Visit Diagnoses:  1. Lumbar radiculopathy   2. Radiculopathy due to lumbar intervertebral disc disorder   3. Congenital spondylolysis   4. Spondylolisthesis of lumbar region     Plan: Findings:  Chronic worsening severe 8 out of 10 leg pain with numbness and tingling from the left buttocks down the  left leg somewhat of an L4 distribution somewhat of a S1 distribution which fits with his current imaging.  Imaging shows left disc herniation foraminal he at the site of pars defects and slippage.  This would seem to be the worst level from an imaging standpoint.  He does have small disc protrusion as well at L5-S1 that could affect the S1 nerve root.  He has been having good conservative care will continue with gabapentin and stretching.  I think the best approach is diagnostic transforaminal injection.   This would be selective injection on the left at L4.  We will see him back in a couple weeks for possible second injection versus different side of injection.  Clearly he could have something that ends up being surgical although he is not having any focal weakness or other things at this point that would make you consider surgery any sooner.  He also does not have any condition that would not potentially get better with pain relief and continued treatment.  Injection was performed today.    Meds & Orders:  Meds ordered this encounter  Medications  . betamethasone acetate-betamethasone sodium phosphate (CELESTONE) injection 12 mg    Orders Placed This Encounter  Procedures  . XR C-ARM NO REPORT  . Epidural Steroid injection    Follow-up: Return in about 2 weeks (around 10/11/2017) for Possible repeat.   Procedures: No procedures performed  Lumbosacral Transforaminal Epidural Steroid Injection - Sub-Pedicular Approach with Fluoroscopic Guidance  Patient: Curtis Butler      Date of Birth: September 29, 1960 MRN: [id number] PCP: Marin Olp, MD      Visit Date: 09/27/2017   Universal Protocol:    Date/Time: 09/27/2017  Consent Given By: the patient  Position: PRONE  Additional Comments: Vital signs were monitored before and after the procedure. Patient was prepped and draped in the usual sterile fashion. The correct patient, procedure, and site was verified.   Injection Procedure Details:  Procedure Site One Meds Administered:  Meds ordered this encounter  Medications  . betamethasone acetate-betamethasone sodium phosphate (CELESTONE) injection 12 mg    Laterality: Left  Location/Site:  L4-L5  Needle size: 22 G  Needle type: Spinal  Needle Placement: Transforaminal  Findings:    -Comments: Excellent flow of contrast along the nerve and into the epidural space.  Procedure Details: After squaring off the end-plates to get a true AP view, the C-arm was  positioned so that an oblique view of the foramen as noted above was visualized. The target area is just inferior to the "nose of the scotty dog" or sub pedicular. The soft tissues overlying this structure were infiltrated with 2-3 ml. of 1% Lidocaine without Epinephrine.  The spinal needle was inserted toward the target using a "trajectory" view along the fluoroscope beam.  Under AP and lateral visualization, the needle was advanced so it did not puncture dura and was located close the 6 O'Clock position of the pedical in AP tracterory. Biplanar projections were used to confirm position. Aspiration was confirmed to be negative for CSF and/or blood. A 1-2 ml. volume of Isovue-250 was injected and flow of contrast was noted at each level. Radiographs were obtained for documentation purposes.   After attaining the desired flow of contrast documented above, a 0.5 to 1.0 ml test dose of 0.25% Marcaine was injected into each respective transforaminal space.  The patient was observed for 90 seconds post injection.  After no sensory deficits were reported, and normal lower extremity motor function  was noted,   the above injectate was administered so that equal amounts of the injectate were placed at each foramen (level) into the transforaminal epidural space.   Additional Comments:  The patient tolerated the procedure well Dressing: Band-Aid    Post-procedure details: Patient was observed during the procedure. Post-procedure instructions were reviewed.  Patient left the clinic in stable condition.    Clinical History: MRI LUMBAR SPINE WITHOUT CONTRAST  TECHNIQUE: Multiplanar, multisequence MR imaging of the lumbar spine was performed. No intravenous contrast was administered.  COMPARISON:  Lumbar spine radiographs 07/29/2017.  FINDINGS: Segmentation: Conventional anatomy assumed, with the last open disc space designated L5-S1.  Alignment: There is 8 mm of anterolisthesis at L4-5. There  is suspicion of underlying bilateral L4 pars defects (more convincing on the right). The alignment is otherwise normal.  Vertebrae: No evidence of acute fracture or focal osseous lesion. As above, suspected bilateral L4 pars defects. The visualized sacroiliac joints appear unremarkable.  Conus medullaris: Extends to the L1 level and appears normal.  Paraspinal and other soft tissues: No significant paraspinal findings.  Disc levels:  The discs are well hydrated with maintained height from T11-12 through L3-4. There is no spinal stenosis or nerve root encroachment. There is mild facet hypertrophy at L3-4.  L4-5: There is moderate loss of disc height with annular disc bulging and a broad-based left foraminal disc protrusion. There is significant left foraminal narrowing and left L4 nerve root encroachment. The right foramen is mildly narrowed. There is mild spinal stenosis with mild narrowing of the lateral recesses, left greater than right. As above, there is suspected bilateral L4 pars defects (more convincing on the right). In addition, there is moderately advanced facet hypertrophy.  L5-S1: Disc degeneration with a shallow left paracentral disc protrusion. There is mild mass effect on the thecal sac and possible encroachment on both S1 nerve roots. No significant foraminal compromise or L5 nerve root encroachment.  IMPRESSION: 1. The most significant findings are at L4-5 where there is advanced disc degeneration, a broad-based left foraminal disc protrusion and 8 mm of anterolisthesis. There is left-greater-than-right foraminal narrowing with possible encroachment on both L4 nerve roots. 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 2. Shallow left paracentral disc protrusion at L5-S1 with potential bilateral S1 nerve root encroachment. 3. No other significant disc space findings.   Electronically Signed   By: Richardean Sale M.D.   On: 09/06/2017 12:12    He reports that he quit smoking about 11 months ago. His smoking use included cigarettes. He has a 8.75 pack-year smoking history. He has never used smokeless tobacco.  Recent Labs    04/08/17 1407  HGBA1C 5.7    Objective:  VS:  HT:    WT:   BMI:     BP:(!) 139/97  HR:83bpm  TEMP:98.2 F (36.8 C)(Oral)  RESP:  Physical Exam  Constitutional: He is oriented to person, place, and time. He appears well-developed and well-nourished. No distress.  HENT:  Head: Normocephalic and atraumatic.  Nose: Nose normal.  Mouth/Throat: Oropharynx is clear and moist.  Eyes: Pupils are equal, round, and reactive to light. Conjunctivae are normal.  Neck: Normal range of motion. Neck supple. No tracheal deviation present.  Cardiovascular: Regular rhythm and intact distal pulses.  Pulmonary/Chest: Effort normal and breath sounds normal.  Abdominal: Soft. He exhibits no distension. There is no rebound and no guarding.  Musculoskeletal: He exhibits no deformity.  Patient walks slightly forward flexed.  He does have pain with facet  joint loading of the lower back but is not his main complaint.  He has some pain over the greater trochanter on the left more than right but again not consistent with most of his pain.  Tenderness over the left sacroiliac joint region and sciatic notch.  He has negative slump test left and right.  He has good distal strength and good strength bilaterally and symmetric in both limbs.  He has no clonus.  Neurological: He is alert and oriented to person, place, and time. He exhibits normal muscle tone. Coordination normal.  Skin: Skin is warm. No rash noted.  Psychiatric: He has a normal mood and affect. His behavior is normal.  Nursing note and vitals reviewed.   Ortho Exam Imaging: No results found.  Past Medical/Family/Surgical/Social History: Medications & Allergies reviewed per EMR, new medications updated. Patient Active Problem List   Diagnosis Date Noted  .  Hyperglycemia 11/05/2016  . Vitamin D deficiency 10/31/2016  . Allergic rhinitis 10/31/2016  . Smoker 10/31/2016  . History of colon polyps 10/31/2016  . Low back pain   . Hyperlipidemia   . GERD (gastroesophageal reflux disease)    Past Medical History:  Diagnosis Date  . GERD (gastroesophageal reflux disease)    prilosec otc sparingly  . Hyperlipidemia    pravastatin 40mg   . Low back pain    left low back into left leg. slipped disc he believes at L3-L4   Family History  Problem Relation Age of Onset  . Dementia Mother        66 in 2018- at friends home  . Hypertension Mother   . Heart disease Father        MI at 3 and killed him. didnt get medical care, cigar smoker  . Healthy Sister   . Healthy Daughter   . Healthy Son   . Cancer Maternal Grandmother        unknown  . Bone cancer Paternal Grandfather        unknown original source   Past Surgical History:  Procedure Laterality Date  . VASECTOMY     Social History   Occupational History  . Not on file  Tobacco Use  . Smoking status: Former Smoker    Packs/day: 0.25    Years: 35.00    Pack years: 8.75    Types: Cigarettes    Last attempt to quit: 11/01/2016    Years since quitting: 0.9  . Smokeless tobacco: Never Used  Substance and Sexual Activity  . Alcohol use: Yes    Comment: 4-5 per week  . Drug use: No  . Sexual activity: Yes

## 2017-10-03 NOTE — Procedures (Signed)
Lumbosacral Transforaminal Epidural Steroid Injection - Sub-Pedicular Approach with Fluoroscopic Guidance  Patient: Curtis Butler      Date of Birth: 11-12-1960 MRN: [id number] PCP: Marin Olp, MD      Visit Date: 09/27/2017   Universal Protocol:    Date/Time: 09/27/2017  Consent Given By: the patient  Position: PRONE  Additional Comments: Vital signs were monitored before and after the procedure. Patient was prepped and draped in the usual sterile fashion. The correct patient, procedure, and site was verified.   Injection Procedure Details:  Procedure Site One Meds Administered:  Meds ordered this encounter  Medications  . betamethasone acetate-betamethasone sodium phosphate (CELESTONE) injection 12 mg    Laterality: Left  Location/Site:  L4-L5  Needle size: 22 G  Needle type: Spinal  Needle Placement: Transforaminal  Findings:    -Comments: Excellent flow of contrast along the nerve and into the epidural space.  Procedure Details: After squaring off the end-plates to get a true AP view, the C-arm was positioned so that an oblique view of the foramen as noted above was visualized. The target area is just inferior to the "nose of the scotty dog" or sub pedicular. The soft tissues overlying this structure were infiltrated with 2-3 ml. of 1% Lidocaine without Epinephrine.  The spinal needle was inserted toward the target using a "trajectory" view along the fluoroscope beam.  Under AP and lateral visualization, the needle was advanced so it did not puncture dura and was located close the 6 O'Clock position of the pedical in AP tracterory. Biplanar projections were used to confirm position. Aspiration was confirmed to be negative for CSF and/or blood. A 1-2 ml. volume of Isovue-250 was injected and flow of contrast was noted at each level. Radiographs were obtained for documentation purposes.   After attaining the desired flow of contrast documented above, a  0.5 to 1.0 ml test dose of 0.25% Marcaine was injected into each respective transforaminal space.  The patient was observed for 90 seconds post injection.  After no sensory deficits were reported, and normal lower extremity motor function was noted,   the above injectate was administered so that equal amounts of the injectate were placed at each foramen (level) into the transforaminal epidural space.   Additional Comments:  The patient tolerated the procedure well Dressing: Band-Aid    Post-procedure details: Patient was observed during the procedure. Post-procedure instructions were reviewed.  Patient left the clinic in stable condition.

## 2017-10-09 ENCOUNTER — Encounter (INDEPENDENT_AMBULATORY_CARE_PROVIDER_SITE_OTHER): Payer: Self-pay | Admitting: Physical Medicine and Rehabilitation

## 2017-10-17 ENCOUNTER — Ambulatory Visit (INDEPENDENT_AMBULATORY_CARE_PROVIDER_SITE_OTHER): Payer: Self-pay

## 2017-10-17 ENCOUNTER — Encounter (INDEPENDENT_AMBULATORY_CARE_PROVIDER_SITE_OTHER): Payer: Self-pay | Admitting: Physical Medicine and Rehabilitation

## 2017-10-17 ENCOUNTER — Ambulatory Visit (INDEPENDENT_AMBULATORY_CARE_PROVIDER_SITE_OTHER): Payer: BLUE CROSS/BLUE SHIELD | Admitting: Physical Medicine and Rehabilitation

## 2017-10-17 VITALS — BP 149/93 | HR 67 | Temp 98.3°F

## 2017-10-17 DIAGNOSIS — M5416 Radiculopathy, lumbar region: Secondary | ICD-10-CM | POA: Diagnosis not present

## 2017-10-17 MED ORDER — BETAMETHASONE SOD PHOS & ACET 6 (3-3) MG/ML IJ SUSP
12.0000 mg | Freq: Once | INTRAMUSCULAR | Status: AC
  Administered 2017-10-17: 12 mg

## 2017-10-17 NOTE — Patient Instructions (Signed)

## 2017-10-17 NOTE — Progress Notes (Signed)
 .  Numeric Pain Rating Scale and Functional Assessment Average Pain 4   In the last MONTH (on 0-10 scale) has pain interfered with the following?  1. General activity like being  able to carry out your everyday physical activities such as walking, climbing stairs, carrying groceries, or moving a chair?  Rating(2)   +Driver, -BT, -Dye Allergies.

## 2017-10-29 NOTE — Procedures (Signed)
Lumbosacral Transforaminal Epidural Steroid Injection - Sub-Pedicular Approach with Fluoroscopic Guidance  Patient: Curtis Butler      Date of Birth: 01-Jun-1960 MRN: [id number] PCP: Marin Olp, MD      Visit Date: 10/17/2017   Universal Protocol:    Date/Time: 10/17/2017  Consent Given By: the patient  Position: PRONE  Additional Comments: Vital signs were monitored before and after the procedure. Patient was prepped and draped in the usual sterile fashion. The correct patient, procedure, and site was verified.   Injection Procedure Details:  Procedure Site One Meds Administered:  Meds ordered this encounter  Medications  . betamethasone acetate-betamethasone sodium phosphate (CELESTONE) injection 12 mg    Laterality: Left  Location/Site:  L4-L5  Needle size: 22 G  Needle type: Spinal  Needle Placement: Transforaminal  Findings:    -Comments: Excellent flow of contrast along the nerve and into the epidural space.  Procedure Details: After squaring off the end-plates to get a true AP view, the C-arm was positioned so that an oblique view of the foramen as noted above was visualized. The target area is just inferior to the "nose of the scotty dog" or sub pedicular. The soft tissues overlying this structure were infiltrated with 2-3 ml. of 1% Lidocaine without Epinephrine.  The spinal needle was inserted toward the target using a "trajectory" view along the fluoroscope beam.  Under AP and lateral visualization, the needle was advanced so it did not puncture dura and was located close the 6 O'Clock position of the pedical in AP tracterory. Biplanar projections were used to confirm position. Aspiration was confirmed to be negative for CSF and/or blood. A 1-2 ml. volume of Isovue-250 was injected and flow of contrast was noted at each level. Radiographs were obtained for documentation purposes.   After attaining the desired flow of contrast documented above, a  0.5 to 1.0 ml test dose of 0.25% Marcaine was injected into each respective transforaminal space.  The patient was observed for 90 seconds post injection.  After no sensory deficits were reported, and normal lower extremity motor function was noted,   the above injectate was administered so that equal amounts of the injectate were placed at each foramen (level) into the transforaminal epidural space.   Additional Comments:  The patient tolerated the procedure well Dressing: Band-Aid    Post-procedure details: Patient was observed during the procedure. Post-procedure instructions were reviewed.  Patient left the clinic in stable condition.

## 2017-10-29 NOTE — Progress Notes (Signed)
Curtis Butler - 57 y.o. male MRN [id number]  Date of birth: 29-Jun-1960  Office Visit Note: Visit Date: 10/17/2017 PCP: Marin Olp, MD Referred by: Marin Olp, MD  Subjective: Chief Complaint  Patient presents with  . Left Hip - Pain   HPI:  Curtis Butler is a 57 y.o. male who comes in today For possible repeat left L4 transforaminal epidural steroid injection.  Prior injection a few weeks ago gave him quite a bit of relief.  His pain level is down to 4 out of 10 but still present.  Is still worse with walking and standing for too long.  He has suspected underlying pars defects at L4 as well as left foraminal protrusion L4 that does fit with his symptoms.  Diagnostically he got better with an L4 injection so I do think the foraminal protrusion is the source of pain at this point.  We are going to repeat the injection today we will see how he does.  Continue to follow-up with Dr. Paulla Fore for conservative care continue with core strengthening and stretching.  ROS Otherwise per HPI.  Assessment & Plan: Visit Diagnoses:  1. Lumbar radiculopathy     Plan: No additional findings.   Meds & Orders:  Meds ordered this encounter  Medications  . betamethasone acetate-betamethasone sodium phosphate (CELESTONE) injection 12 mg    Orders Placed This Encounter  Procedures  . XR C-ARM NO REPORT  . Epidural Steroid injection    Follow-up: Return if symptoms worsen or fail to improve.   Procedures: No procedures performed  Lumbosacral Transforaminal Epidural Steroid Injection - Sub-Pedicular Approach with Fluoroscopic Guidance  Patient: Curtis Butler      Date of Birth: 05/07/1960 MRN: [id number] PCP: Marin Olp, MD      Visit Date: 10/17/2017   Universal Protocol:    Date/Time: 10/17/2017  Consent Given By: the patient  Position: PRONE  Additional Comments: Vital signs were monitored before and after the procedure. Patient was prepped and draped  in the usual sterile fashion. The correct patient, procedure, and site was verified.   Injection Procedure Details:  Procedure Site One Meds Administered:  Meds ordered this encounter  Medications  . betamethasone acetate-betamethasone sodium phosphate (CELESTONE) injection 12 mg    Laterality: Left  Location/Site:  L4-L5  Needle size: 22 G  Needle type: Spinal  Needle Placement: Transforaminal  Findings:    -Comments: Excellent flow of contrast along the nerve and into the epidural space.  Procedure Details: After squaring off the end-plates to get a true AP view, the C-arm was positioned so that an oblique view of the foramen as noted above was visualized. The target area is just inferior to the "nose of the scotty dog" or sub pedicular. The soft tissues overlying this structure were infiltrated with 2-3 ml. of 1% Lidocaine without Epinephrine.  The spinal needle was inserted toward the target using a "trajectory" view along the fluoroscope beam.  Under AP and lateral visualization, the needle was advanced so it did not puncture dura and was located close the 6 O'Clock position of the pedical in AP tracterory. Biplanar projections were used to confirm position. Aspiration was confirmed to be negative for CSF and/or blood. A 1-2 ml. volume of Isovue-250 was injected and flow of contrast was noted at each level. Radiographs were obtained for documentation purposes.   After attaining the desired flow of contrast documented above, a 0.5 to 1.0 ml test dose of 0.25% Marcaine  was injected into each respective transforaminal space.  The patient was observed for 90 seconds post injection.  After no sensory deficits were reported, and normal lower extremity motor function was noted,   the above injectate was administered so that equal amounts of the injectate were placed at each foramen (level) into the transforaminal epidural space.   Additional Comments:  The patient tolerated the  procedure well Dressing: Band-Aid    Post-procedure details: Patient was observed during the procedure. Post-procedure instructions were reviewed.  Patient left the clinic in stable condition.     Clinical History: MRI LUMBAR SPINE WITHOUT CONTRAST  TECHNIQUE: Multiplanar, multisequence MR imaging of the lumbar spine was performed. No intravenous contrast was administered.  COMPARISON:  Lumbar spine radiographs 07/29/2017.  FINDINGS: Segmentation: Conventional anatomy assumed, with the last open disc space designated L5-S1.  Alignment: There is 8 mm of anterolisthesis at L4-5. There is suspicion of underlying bilateral L4 pars defects (more convincing on the right). The alignment is otherwise normal.  Vertebrae: No evidence of acute fracture or focal osseous lesion. As above, suspected bilateral L4 pars defects. The visualized sacroiliac joints appear unremarkable.  Conus medullaris: Extends to the L1 level and appears normal.  Paraspinal and other soft tissues: No significant paraspinal findings.  Disc levels:  The discs are well hydrated with maintained height from T11-12 through L3-4. There is no spinal stenosis or nerve root encroachment. There is mild facet hypertrophy at L3-4.  L4-5: There is moderate loss of disc height with annular disc bulging and a broad-based left foraminal disc protrusion. There is significant left foraminal narrowing and left L4 nerve root encroachment. The right foramen is mildly narrowed. There is mild spinal stenosis with mild narrowing of the lateral recesses, left greater than right. As above, there is suspected bilateral L4 pars defects (more convincing on the right). In addition, there is moderately advanced facet hypertrophy.  L5-S1: Disc degeneration with a shallow left paracentral disc protrusion. There is mild mass effect on the thecal sac and possible encroachment on both S1 nerve roots. No significant  foraminal compromise or L5 nerve root encroachment.  IMPRESSION: 1. The most significant findings are at L4-5 where there is advanced disc degeneration, a broad-based left foraminal disc protrusion and 8 mm of anterolisthesis. There is left-greater-than-right foraminal narrowing with possible encroachment on both L4 nerve roots. 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 2. Shallow left paracentral disc protrusion at L5-S1 with potential bilateral S1 nerve root encroachment. 3. No other significant disc space findings.   Electronically Signed   By: Richardean Sale M.D.   On: 09/06/2017 12:12     Objective:  VS:  HT:    WT:   BMI:     BP:(!) 149/93  HR:67bpm  TEMP:98.3 F (36.8 C)(Oral)  RESP:  Physical Exam  Ortho Exam Imaging: No results found.

## 2017-11-07 ENCOUNTER — Encounter: Payer: Self-pay | Admitting: Family Medicine

## 2017-11-08 ENCOUNTER — Ambulatory Visit: Payer: BLUE CROSS/BLUE SHIELD | Admitting: Family Medicine

## 2017-11-08 ENCOUNTER — Encounter: Payer: Self-pay | Admitting: Family Medicine

## 2017-11-08 VITALS — BP 130/88 | HR 92 | Temp 98.5°F | Ht 66.25 in | Wt 171.8 lb

## 2017-11-08 DIAGNOSIS — J40 Bronchitis, not specified as acute or chronic: Secondary | ICD-10-CM | POA: Diagnosis not present

## 2017-11-08 MED ORDER — GUAIFENESIN-CODEINE 100-10 MG/5ML PO SOLN: 10.0000 mL | Freq: Four times a day (QID) | ORAL | 0 refills | Status: DC | PRN

## 2017-11-08 MED ORDER — BENZONATATE 200 MG PO CAPS: 200.0000 mg | ORAL_CAPSULE | Freq: Three times a day (TID) | ORAL | 1 refills | Status: DC | PRN

## 2017-11-08 MED ORDER — FLUTICASONE PROPIONATE 50 MCG/ACT NA SUSP: 2.0000 | Freq: Every day | NASAL | 6 refills | Status: DC

## 2017-11-08 NOTE — Patient Instructions (Addendum)
Cool mist humidifier at night flonase at night to help post nasal drip Tessalon pearls up to three times a day as needed for cough Codeine cough syrup as needed for cough, especially at bedtime.  I really like robitussin DM during day and 1 tablespoon of honey.   Acute Bronchitis, Adult Acute bronchitis is when air tubes (bronchi) in the lungs suddenly get swollen. The condition can make it hard to breathe. It can also cause these symptoms:  A cough.  Coughing up clear, yellow, or green mucus.  Wheezing.  Chest congestion.  Shortness of breath.  A fever.  Body aches.  Chills.  A sore throat.  Follow these instructions at home: Medicines  Take over-the-counter and prescription medicines only as told by your doctor.  If you were prescribed an antibiotic medicine, take it as told by your doctor. Do not stop taking the antibiotic even if you start to feel better. General instructions  Rest.  Drink enough fluids to keep your pee (urine) clear or pale yellow.  Avoid smoking and secondhand smoke. If you smoke and you need help quitting, ask your doctor. Quitting will help your lungs heal faster.  Use an inhaler, cool mist vaporizer, or humidifier as told by your doctor.  Keep all follow-up visits as told by your doctor. This is important. How is this prevented? To lower your risk of getting this condition again:  Wash your hands often with soap and water. If you cannot use soap and water, use hand sanitizer.  Avoid contact with people who have cold symptoms.  Try not to touch your hands to your mouth, nose, or eyes.  Make sure to get the flu shot every year.  Contact a doctor if:  Your symptoms do not get better in 2 weeks. Get help right away if:  You cough up blood.  You have chest pain.  You have very bad shortness of breath.  You become dehydrated.  You faint (pass out) or keep feeling like you are going to pass out.  You keep throwing up  (vomiting).  You have a very bad headache.  Your fever or chills gets worse. This information is not intended to replace advice given to you by your health care provider. Make sure you discuss any questions you have with your health care provider. Document Released: 06/06/2007 Document Revised: 07/27/2015 Document Reviewed: 06/08/2015 Elsevier Interactive Patient Education  [employer].

## 2017-11-08 NOTE — Progress Notes (Signed)
Patient: Curtis Butler MRN: [id number] DOB: [birth year] PCP: Marin Olp, MD     Subjective:  Chief Complaint  Patient presents with  . Cough    HPI: The patient is a 57 y.o. male who presents today for cough x 3 weeks. He states 3 weeks it started as a tickle and throat clearing and then a week later it became a cough. He has continued to cough. Cough is dry in nature. He has been taking mucinex DM with no relief. He is a smoker. He really has no other symptoms except nasal drainage. His wife was sick, but with different symptoms. No fever/chills, NO N/V/D, no ear pain, no throat pain/sinus pain or pressure. He has no shortness of breath or wheezing, but states he can hear himself breath at night and it may be wheezing.   Review of Systems  Constitutional: Negative for chills, fatigue and fever.  HENT: Positive for rhinorrhea. Negative for congestion, ear pain, mouth sores, sinus pressure, sinus pain, sneezing and sore throat.   Eyes: Negative for discharge and itching.  Respiratory: Positive for cough. Negative for chest tightness, shortness of breath and wheezing.   Cardiovascular: Negative for chest pain and palpitations.  Gastrointestinal: Negative for abdominal pain, diarrhea, nausea and vomiting.  Skin: Negative for rash.    Allergies Patient is allergic to penicillins.  Past Medical History Patient  has a past medical history of GERD (gastroesophageal reflux disease), Hyperlipidemia, and Low back pain.  Surgical History Patient  has a past surgical history that includes Vasectomy.  Family History Pateint's family history includes Bone cancer in his paternal grandfather; Cancer in his maternal grandmother; Dementia in his mother; Healthy in his daughter, sister, and son; Heart disease in his father; Hypertension in his mother.  Social History Patient  reports that he quit smoking about a year ago. His smoking use included cigarettes. He has a 8.75 pack-year  smoking history. He has never used smokeless tobacco. He reports that he drinks alcohol. He reports that he does not use drugs.    Objective: Vitals:   11/08/17 1341  BP: 130/88  Pulse: 92  Temp: 98.5 F (36.9 C)  TempSrc: Oral  SpO2: 96%  Weight: 171 lb 12.8 oz (77.9 kg)  Height: 5' 6.25" (1.683 m)    Body mass index is 27.52 kg/m.  Physical Exam  Constitutional: He appears well-developed and well-nourished.  HENT:  Right Ear: External ear normal.  Left Ear: External ear normal.  Mouth/Throat: Oropharynx is clear and moist. No oropharyngeal exudate.  Tm pearly with light reflex bilaterally    Neck: Normal range of motion. Neck supple. No thyromegaly present.  Cardiovascular: Normal rate, regular rhythm and normal heart sounds.  Pulmonary/Chest: Effort normal and breath sounds normal. No respiratory distress. He has no wheezes. He has no rales.  Abdominal: Soft. Bowel sounds are normal.  Lymphadenopathy:    He has no cervical adenopathy.  Skin: Skin is warm and dry. Capillary refill takes less than 2 seconds.  Vitals reviewed.      Assessment/plan: 1. Bronchitis Lung exam clear. Discussed bronchitis is viral in >80% of cases and no indication for abx at this time. Supportive therapy for cough which can last up to 6 weeks. Will do conservative therapy with cool mist humidifier, honey, tessalon pearls prn and codeine cough syrup prn. Drowsy precautions given. Also sending him in flonase to take at night and recommended robitussin DM during the day. Let us know if change/worsening symptoms, fever, productive cough  or no improvement in the next week.     Return if symptoms worsen or fail to improve.     Orma Flaming, MD [place]  11/08/2017

## 2017-12-10 ENCOUNTER — Encounter: Payer: Self-pay | Admitting: Family Medicine

## 2017-12-10 ENCOUNTER — Other Ambulatory Visit: Payer: Self-pay

## 2017-12-10 MED ORDER — PRAVASTATIN SODIUM 40 MG PO TABS: 40.0000 mg | ORAL_TABLET | Freq: Every day | ORAL | 1 refills | Status: DC

## 2017-12-24 ENCOUNTER — Encounter (INDEPENDENT_AMBULATORY_CARE_PROVIDER_SITE_OTHER): Payer: Self-pay | Admitting: Physical Medicine and Rehabilitation

## 2017-12-24 ENCOUNTER — Encounter: Payer: Self-pay | Admitting: Family Medicine

## 2017-12-27 ENCOUNTER — Telehealth (INDEPENDENT_AMBULATORY_CARE_PROVIDER_SITE_OTHER): Payer: Self-pay | Admitting: Radiology

## 2017-12-27 NOTE — Telephone Encounter (Signed)
Patient called requesting appointment for injection.  Last injection 10/17/17 Left L4-L5 TF  Ok to repeat?

## 2017-12-30 NOTE — Telephone Encounter (Signed)
Yes okay for repeat L4 transforaminal injection

## 2017-12-30 NOTE — Telephone Encounter (Signed)
Pt scheduled 01/13/18 with driver.

## 2017-12-30 NOTE — Telephone Encounter (Signed)
Called pt and lvm #1.   No PA is needed for [phone] (see referral).

## 2018-01-13 ENCOUNTER — Encounter (INDEPENDENT_AMBULATORY_CARE_PROVIDER_SITE_OTHER): Payer: Self-pay | Admitting: Physical Medicine and Rehabilitation

## 2018-01-13 ENCOUNTER — Ambulatory Visit (INDEPENDENT_AMBULATORY_CARE_PROVIDER_SITE_OTHER): Payer: BLUE CROSS/BLUE SHIELD | Admitting: Physical Medicine and Rehabilitation

## 2018-01-13 ENCOUNTER — Ambulatory Visit (INDEPENDENT_AMBULATORY_CARE_PROVIDER_SITE_OTHER): Payer: Self-pay

## 2018-01-13 VITALS — BP 133/83 | HR 64 | Temp 98.3°F

## 2018-01-13 DIAGNOSIS — M5416 Radiculopathy, lumbar region: Secondary | ICD-10-CM

## 2018-01-13 DIAGNOSIS — M5116 Intervertebral disc disorders with radiculopathy, lumbar region: Secondary | ICD-10-CM

## 2018-01-13 DIAGNOSIS — M4316 Spondylolisthesis, lumbar region: Secondary | ICD-10-CM

## 2018-01-13 DIAGNOSIS — Q762 Congenital spondylolisthesis: Secondary | ICD-10-CM

## 2018-01-13 MED ORDER — BETAMETHASONE SOD PHOS & ACET 6 (3-3) MG/ML IJ SUSP
12.0000 mg | Freq: Once | INTRAMUSCULAR | Status: AC
  Administered 2018-01-13: 12 mg

## 2018-01-13 NOTE — Progress Notes (Signed)
Brandell Boris Engelmann - 58 y.o. male MRN [id number]  Date of birth: [birth year]  Office Visit Note: Visit Date: 01/13/2018 PCP: Marin Olp, MD Referred by: Marin Olp, MD  Subjective: Chief Complaint  Patient presents with  . Left Leg - Pain   HPI: Curtis Butler is a 58 y.o. male who comes in today For evaluation and management of return to left radicular leg pain and a pretty classic L4 distribution.  Patient has had 2 prior lumbar L4 transforaminal epidural steroid injections both with really good relief lasting 2 to 3 months.  Lumbar spine imaging reviewed with the patient again today.  He has had return of symptoms just recently.  He gets most of the symptoms with walking and standing.  Medications helped a little bit.  Rates his pain a 7 out of 10.  First injection was very beneficial but very short-lived last injection in October lasted up till the end of December.  He does wish to consult neurosurgery for evaluation management will place referral for this.  He continues see Dr. Paulla Fore for conservative care as well.  He does not describe any focal weakness and on exam today has no focal weakness and ambulates without aid.  ROS Otherwise per HPI.  Assessment & Plan: Visit Diagnoses:  1. Lumbar radiculopathy   2. Radiculopathy due to lumbar intervertebral disc disorder   3. Congenital spondylolysis   4. Spondylolisthesis of lumbar region     Plan: No additional findings.   Meds & Orders:  Meds ordered this encounter  Medications  . betamethasone acetate-betamethasone sodium phosphate (CELESTONE) injection 12 mg    Orders Placed This Encounter  Procedures  . XR C-ARM NO REPORT  . Ambulatory referral to Neurosurgery  . Epidural Steroid injection    Follow-up: Return if symptoms worsen or fail to improve.   Procedures: No procedures performed  Lumbosacral Transforaminal Epidural Steroid Injection - Sub-Pedicular Approach with Fluoroscopic Guidance  Patient:  Curtis Butler      Date of Birth: Jan 20, 1960 MRN: [id number] PCP: Marin Olp, MD      Visit Date: 01/13/2018   Universal Protocol:    Date/Time: 01/13/2018  Consent Given By: the patient  Position: PRONE  Additional Comments: Vital signs were monitored before and after the procedure. Patient was prepped and draped in the usual sterile fashion. The correct patient, procedure, and site was verified.   Injection Procedure Details:  Procedure Site One Meds Administered:  Meds ordered this encounter  Medications  . betamethasone acetate-betamethasone sodium phosphate (CELESTONE) injection 12 mg    Laterality: Left  Location/Site:  L4-L5  Needle size: 22 G  Needle type: Spinal  Needle Placement: Transforaminal  Findings:    -Comments: Excellent flow of contrast along the nerve and into the epidural space.  Procedure Details: After squaring off the end-plates to get a true AP view, the C-arm was positioned so that an oblique view of the foramen as noted above was visualized. The target area is just inferior to the "nose of the scotty dog" or sub pedicular. The soft tissues overlying this structure were infiltrated with 2-3 ml. of 1% Lidocaine without Epinephrine.  The spinal needle was inserted toward the target using a "trajectory" view along the fluoroscope beam.  Under AP and lateral visualization, the needle was advanced so it did not puncture dura and was located close the 6 O'Clock position of the pedical in AP tracterory. Biplanar projections were used to confirm position. Aspiration was  confirmed to be negative for CSF and/or blood. A 1-2 ml. volume of Isovue-250 was injected and flow of contrast was noted at each level. Radiographs were obtained for documentation purposes.   After attaining the desired flow of contrast documented above, a 0.5 to 1.0 ml test dose of 0.25% Marcaine was injected into each respective transforaminal space.  The patient was  observed for 90 seconds post injection.  After no sensory deficits were reported, and normal lower extremity motor function was noted,   the above injectate was administered so that equal amounts of the injectate were placed at each foramen (level) into the transforaminal epidural space.   Additional Comments:  The patient tolerated the procedure well Dressing: Band-Aid    Post-procedure details: Patient was observed during the procedure. Post-procedure instructions were reviewed.  Patient left the clinic in stable condition.     Clinical History: MRI LUMBAR SPINE WITHOUT CONTRAST  TECHNIQUE: Multiplanar, multisequence MR imaging of the lumbar spine was performed. No intravenous contrast was administered.  COMPARISON:  Lumbar spine radiographs 07/29/2017.  FINDINGS: Segmentation: Conventional anatomy assumed, with the last open disc space designated L5-S1.  Alignment: There is 8 mm of anterolisthesis at L4-5. There is suspicion of underlying bilateral L4 pars defects (more convincing on the right). The alignment is otherwise normal.  Vertebrae: No evidence of acute fracture or focal osseous lesion. As above, suspected bilateral L4 pars defects. The visualized sacroiliac joints appear unremarkable.  Conus medullaris: Extends to the L1 level and appears normal.  Paraspinal and other soft tissues: No significant paraspinal findings.  Disc levels:  The discs are well hydrated with maintained height from T11-12 through L3-4. There is no spinal stenosis or nerve root encroachment. There is mild facet hypertrophy at L3-4.  L4-5: There is moderate loss of disc height with annular disc bulging and a broad-based left foraminal disc protrusion. There is significant left foraminal narrowing and left L4 nerve root encroachment. The right foramen is mildly narrowed. There is mild spinal stenosis with mild narrowing of the lateral recesses, left greater than right. As  above, there is suspected bilateral L4 pars defects (more convincing on the right). In addition, there is moderately advanced facet hypertrophy.  L5-S1: Disc degeneration with a shallow left paracentral disc protrusion. There is mild mass effect on the thecal sac and possible encroachment on both S1 nerve roots. No significant foraminal compromise or L5 nerve root encroachment.  IMPRESSION: 1. The most significant findings are at L4-5 where there is advanced disc degeneration, a broad-based left foraminal disc protrusion and 8 mm of anterolisthesis. There is left-greater-than-right foraminal narrowing with possible encroachment on both L4 nerve roots. 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 2. Shallow left paracentral disc protrusion at L5-S1 with potential bilateral S1 nerve root encroachment. 3. No other significant disc space findings.   Electronically Signed   By: Richardean Sale M.D.   On: 09/06/2017 12:12   He reports that he quit smoking about 14 months ago. His smoking use included cigarettes. He has a 8.75 pack-year smoking history. He has never used smokeless tobacco.  Recent Labs    04/08/17 1407  HGBA1C 5.7    Objective:  VS:  HT:    WT:   BMI:     BP:133/83  HR:64bpm  TEMP:98.3 F (36.8 C)(Oral)  RESP:  Physical Exam  Ortho Exam Imaging: Xr C-arm No Report  Result Date: 01/13/2018 Please see Notes tab for imaging impression.   Past Medical/Family/Surgical/Social History: Medications & Allergies  reviewed per EMR, new medications updated. Patient Active Problem List   Diagnosis Date Noted  . Hyperglycemia 11/05/2016  . Vitamin D deficiency 10/31/2016  . Allergic rhinitis 10/31/2016  . Smoker 10/31/2016  . History of colon polyps 10/31/2016  . Low back pain   . Hyperlipidemia   . GERD (gastroesophageal reflux disease)    Past Medical History:  Diagnosis Date  . GERD (gastroesophageal reflux disease)    prilosec otc sparingly  .  Hyperlipidemia    pravastatin 40mg   . Low back pain    left low back into left leg. slipped disc he believes at L3-L4   Family History  Problem Relation Age of Onset  . Dementia Mother        2 in 2018- at friends home  . Hypertension Mother   . Heart disease Father        MI at 38 and killed him. didnt get medical care, cigar smoker  . Healthy Sister   . Healthy Daughter   . Healthy Son   . Cancer Maternal Grandmother        unknown  . Bone cancer Paternal Grandfather        unknown original source   Past Surgical History:  Procedure Laterality Date  . VASECTOMY     Social History   Occupational History  . Not on file  Tobacco Use  . Smoking status: Former Smoker    Packs/day: 0.25    Years: 35.00    Pack years: 8.75    Types: Cigarettes    Last attempt to quit: 11/01/2016    Years since quitting: 1.2  . Smokeless tobacco: Never Used  Substance and Sexual Activity  . Alcohol use: Yes    Comment: 4-5 per week  . Drug use: No  . Sexual activity: Yes

## 2018-01-13 NOTE — Procedures (Signed)
Lumbosacral Transforaminal Epidural Steroid Injection - Sub-Pedicular Approach with Fluoroscopic Guidance  Patient: Curtis Butler      Date of Birth: November 20, 1960 MRN: [id number] PCP: Marin Olp, MD      Visit Date: 01/13/2018   Universal Protocol:    Date/Time: 01/13/2018  Consent Given By: the patient  Position: PRONE  Additional Comments: Vital signs were monitored before and after the procedure. Patient was prepped and draped in the usual sterile fashion. The correct patient, procedure, and site was verified.   Injection Procedure Details:  Procedure Site One Meds Administered:  Meds ordered this encounter  Medications  . betamethasone acetate-betamethasone sodium phosphate (CELESTONE) injection 12 mg    Laterality: Left  Location/Site:  L4-L5  Needle size: 22 G  Needle type: Spinal  Needle Placement: Transforaminal  Findings:    -Comments: Excellent flow of contrast along the nerve and into the epidural space.  Procedure Details: After squaring off the end-plates to get a true AP view, the C-arm was positioned so that an oblique view of the foramen as noted above was visualized. The target area is just inferior to the "nose of the scotty dog" or sub pedicular. The soft tissues overlying this structure were infiltrated with 2-3 ml. of 1% Lidocaine without Epinephrine.  The spinal needle was inserted toward the target using a "trajectory" view along the fluoroscope beam.  Under AP and lateral visualization, the needle was advanced so it did not puncture dura and was located close the 6 O'Clock position of the pedical in AP tracterory. Biplanar projections were used to confirm position. Aspiration was confirmed to be negative for CSF and/or blood. A 1-2 ml. volume of Isovue-250 was injected and flow of contrast was noted at each level. Radiographs were obtained for documentation purposes.   After attaining the desired flow of contrast documented above, a  0.5 to 1.0 ml test dose of 0.25% Marcaine was injected into each respective transforaminal space.  The patient was observed for 90 seconds post injection.  After no sensory deficits were reported, and normal lower extremity motor function was noted,   the above injectate was administered so that equal amounts of the injectate were placed at each foramen (level) into the transforaminal epidural space.   Additional Comments:  The patient tolerated the procedure well Dressing: Band-Aid    Post-procedure details: Patient was observed during the procedure. Post-procedure instructions were reviewed.  Patient left the clinic in stable condition.

## 2018-01-13 NOTE — Patient Instructions (Signed)

## 2018-01-13 NOTE — Progress Notes (Signed)
 .  Numeric Pain Rating Scale and Functional Assessment Average Pain 7   In the last MONTH (on 0-10 scale) has pain interfered with the following?  1. General activity like being  able to carry out your everyday physical activities such as walking, climbing stairs, carrying groceries, or moving a chair?  Rating(5)   +Driver, -BT, -Dye Allergies.  

## 2018-01-14 DIAGNOSIS — R030 Elevated blood-pressure reading, without diagnosis of hypertension: Secondary | ICD-10-CM | POA: Diagnosis not present

## 2018-01-14 DIAGNOSIS — M5126 Other intervertebral disc displacement, lumbar region: Secondary | ICD-10-CM | POA: Diagnosis not present

## 2018-01-14 DIAGNOSIS — M5416 Radiculopathy, lumbar region: Secondary | ICD-10-CM | POA: Diagnosis not present

## 2018-01-14 DIAGNOSIS — M4316 Spondylolisthesis, lumbar region: Secondary | ICD-10-CM | POA: Diagnosis not present

## 2018-04-11 ENCOUNTER — Other Ambulatory Visit: Payer: Self-pay | Admitting: Family Medicine

## 2018-04-15 ENCOUNTER — Encounter: Payer: Self-pay | Admitting: Family Medicine

## 2018-04-15 ENCOUNTER — Ambulatory Visit (INDEPENDENT_AMBULATORY_CARE_PROVIDER_SITE_OTHER): Payer: BLUE CROSS/BLUE SHIELD | Admitting: Family Medicine

## 2018-04-15 VITALS — Ht 66.25 in | Wt 165.0 lb

## 2018-04-15 DIAGNOSIS — E663 Overweight: Secondary | ICD-10-CM

## 2018-04-15 DIAGNOSIS — E785 Hyperlipidemia, unspecified: Secondary | ICD-10-CM | POA: Diagnosis not present

## 2018-04-15 DIAGNOSIS — R739 Hyperglycemia, unspecified: Secondary | ICD-10-CM

## 2018-04-15 MED ORDER — PRAVASTATIN SODIUM 40 MG PO TABS: 40.0000 mg | ORAL_TABLET | Freq: Every day | ORAL | 3 refills | Status: DC

## 2018-04-15 NOTE — Patient Instructions (Addendum)
Health Maintenance Due  Topic Date Due  . Hepatitis C Screening - consider with next in person labs Aug 31, 1960  . HIV Screening  10/17/1975   Video visit  cpe in 6 months

## 2018-04-15 NOTE — Telephone Encounter (Signed)
Pt called back and will be scheduled by our front office staff. No further action needed at this time!

## 2018-04-15 NOTE — Progress Notes (Addendum)
Phone [phone]   Subjective:  Virtual visit via Video note. Chief complaint: Chief Complaint  Patient presents with  . Medication Refill    Pravastatin 40    This visit type was conducted due to national recommendations for restrictions regarding the COVID-19 Pandemic (e.g. social distancing).  This format is felt to be most appropriate for this patient at this time balancing risks to patient and risks to population by having him in for in person visit.  No physical exam was performed (except for noted visual exam or audio findings with Telehealth visits).    Our team/I connected with Curtis Butler on 04/15/18 at  4:20 PM EDT by a video enabled telemedicine application (doxy.me) and verified that I am speaking with the correct person using two identifiers.  Location patient: Home-O2 Location provider: [facility], office Persons participating in the virtual visit:  patient  Our team/I discussed the limitations of evaluation and management by telemedicine and the availability of in person appointments. In light of current covid-19 pandemic, patient also understands that we are trying to protect them by minimizing in office contact if at all possible.  The patient expressed consent for telemedicine visit and agreed to proceed. Patient understands insurance will be billed.   ROS- no reported chest pain, shortness of breath. Reports slight weight loss with increased exercise. No reported edema. No myalgias on statin.    Past Medical History-  Patient Active Problem List   Diagnosis Date Noted  . Former smoker 10/31/2016    Priority: High  . Low back pain     Priority: Medium  . Hyperlipidemia     Priority: Medium  . Vitamin D deficiency 10/31/2016    Priority: Low  . Allergic rhinitis 10/31/2016    Priority: Low  . GERD (gastroesophageal reflux disease)     Priority: Low  . Hyperglycemia 11/05/2016  . History of colon polyps 10/31/2016    Medications- reviewed and  updated Current Outpatient Medications  Medication Sig Dispense Refill  . pravastatin (PRAVACHOL) 40 MG tablet Take 1 tablet (40 mg total) by mouth daily. 90 tablet 3  . Vitamin D, Ergocalciferol, 2000 units CAPS Take 1 capsule by mouth as needed.      No current facility-administered medications for this visit.      Objective:  Ht 5' 6.25" (1.683 m)   Wt 165 lb (74.8 kg)   BMI 26.43 kg/m  Gen: NAD, resting comfortably Lungs: nonlabored, normal respiratory rate  Skin: appears dry, no obvious rash Normal speech    Assessment and Plan   #hyperlipidemia/hyperglycemia/overweight S: well controlled on pravastatin 40mg . Reports [facility] stated they are sending a form about possible alternatives- hes not sure why.   Weight down 6 lbs but he states obviously - he would normally be clothed in office. He is trying to exercise more while being at home and also try to eat better Lab Results  Component Value Date   HGBA1C 5.7 04/08/2017    Lab Results  Component Value Date   CHOL 153 04/01/2017   HDL 39.70 04/01/2017   LDLCALC 84 04/01/2017   TRIG 142.0 04/01/2017   CHOLHDL 4 04/01/2017   A/P: lipids with good control- this would normally be time for his CPE but we opted to push this out 6 months with covid 19. [employer] will pay for physical now but he preferred in person physical exam and labs at that time which is reasonable.    In regards to weight- Encouraged need for healthy  eating, regular exercise, weight loss.  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 Daughter staying with them for now.   2. Has had 3 injections- 1st two lasted 2 months, last one has really held on longer. Not bothering him even with exercise.  3. Bronchitis from November healed up after at least several weeks  6 months CPE planned hopefully in person He  will call in for this- I set a reminder to check on this at 5 months  Lab/Order associations: Hyperlipidemia, unspecified hyperlipidemia type  Hyperglycemia  Overweight  Meds ordered this encounter  Medications  . pravastatin (PRAVACHOL) 40 MG tablet    Sig: Take 1 tablet (40 mg total) by mouth daily.    Dispense:  90 tablet    Refill:  3   Return precautions advised.  Garret Reddish, MD

## 2018-04-15 NOTE — Telephone Encounter (Signed)
Patient need to schedule an ov for further refills. Lm for pt to return phone call. Rx refilled for 30 days.

## 2018-04-21 ENCOUNTER — Telehealth (INDEPENDENT_AMBULATORY_CARE_PROVIDER_SITE_OTHER): Payer: Self-pay | Admitting: Physical Medicine and Rehabilitation

## 2018-04-21 NOTE — Telephone Encounter (Signed)
Yes ok if similar pattern but fyi could have S1 issue on same side from MRI pics.

## 2018-04-23 NOTE — Telephone Encounter (Signed)
Scheduled for 06/02/18 at 0815 with driver.

## 2018-04-27 ENCOUNTER — Encounter: Payer: Self-pay | Admitting: Family Medicine

## 2018-04-29 ENCOUNTER — Encounter: Payer: Self-pay | Admitting: Family Medicine

## 2018-04-29 ENCOUNTER — Ambulatory Visit (INDEPENDENT_AMBULATORY_CARE_PROVIDER_SITE_OTHER): Payer: BLUE CROSS/BLUE SHIELD | Admitting: Family Medicine

## 2018-04-29 VITALS — Ht 66.25 in | Wt 165.0 lb

## 2018-04-29 DIAGNOSIS — R05 Cough: Secondary | ICD-10-CM | POA: Diagnosis not present

## 2018-04-29 DIAGNOSIS — R059 Cough, unspecified: Secondary | ICD-10-CM

## 2018-04-29 DIAGNOSIS — R0989 Other specified symptoms and signs involving the circulatory and respiratory systems: Secondary | ICD-10-CM

## 2018-04-29 NOTE — Patient Instructions (Addendum)
Health Maintenance Due  Topic Date Due  . Hepatitis C Screening  04/30/1960  . HIV Screening  10/17/1975   Depression screen [facility] 2/9 04/15/2018 10/31/2016  Decreased Interest 0 0  Down, Depressed, Hopeless 0 0  PHQ - 2 Score 0 0   Video visit

## 2018-04-29 NOTE — Progress Notes (Signed)
Phone [phone]   Subjective:  Virtual visit via Video note. Chief complaint: Chief Complaint  Patient presents with  . Tickle in throat  . Cough   This visit type was conducted due to national recommendations for restrictions regarding the COVID-19 Pandemic (e.g. social distancing).  This format is felt to be most appropriate for this patient at this time balancing risks to patient and risks to population by having him in for in person visit.  No physical exam was performed (except for noted visual exam or audio findings with Telehealth visits).    Our team/I connected with Danford Bad on 04/29/18 at  8:00 AM EDT by a video enabled telemedicine application (doxy.me) and verified that I am speaking with the correct person using two identifiers.  Location patient: Home[place]O2 Location provider: [facility], office Persons participating in the virtual visit:  patient  Our team/I discussed the limitations of evaluation and management by telemedicine and the availability of in person appointments. In light of current covid-19 pandemic, patient also understands that we are trying to protect them by minimizing in office contact if at all possible.  The patient expressed consent for telemedicine visit and agreed to proceed. Patient understands insurance will be billed.   ROS- no shortness of breath, no chest pain. No sore throat.  No fever, chills, loss of smell or taste.   Past Medical History-  Patient Active Problem List   Diagnosis Date Noted  . Former smoker 10/31/2016    Priority: High  . Low back pain     Priority: Medium  . Hyperlipidemia     Priority: Medium  . Vitamin D deficiency 10/31/2016    Priority: Low  . Allergic rhinitis 10/31/2016    Priority: Low  . GERD (gastroesophageal reflux disease)     Priority: Low  . Hyperglycemia 11/05/2016  . History of colon polyps 10/31/2016    Medications- reviewed and updated Current Outpatient Medications  Medication Sig  Dispense Refill  . pravastatin (PRAVACHOL) 40 MG tablet Take 1 tablet (40 mg total) by mouth daily. 90 tablet 3  . Vitamin D, Ergocalciferol, 2000 units CAPS Take 1 capsule by mouth as needed.      No current facility-administered medications for this visit.      Objective:  Ht 5' 6.25" (1.683 m)   Wt 165 lb (74.8 kg)   BMI 26.43 kg/m  Gen: NAD, resting comfortably Lungs: nonlabored, normal respiratory rate  Skin: appears dry, no obvious rash    Assessment and Plan   # cough/throat tickle S:patient states he has had a persistent tickle in his throat. Had bronchitis in throat from November 2019- saw Dr. Rogers Blocker in November. Then saw work for care- took an antibiotic and it didn't seem to make a significant difference. The tickle in his throat did not resolve  Over last 2 weeks has worsened again. Feeling tickle in throat and coughing- seems to be particularly bad at night.  Watery itchy eyes and sneezing with mowing or going golfing. He is taking claritin as needed.   Low risk for covid 19- has been staying at home. Cough mild and occasional.  A/P: 58 year old male who had bronchitis back in the fall- treated with antibiotics by health clinic at work without resolution- eventually cough resolved.  Has had a persistent tickle in his throat since that time then a few days ago started with a dry cough also.  No known exposures to COVID-19 and no symptoms outside of the cough.  Patient  does have a history of allergies- sneezing and watery eyes after golf or mowing the grass.  We opted to try about 3 weeks of Claritin.  Also has a history of GERD- this could be a cough triggered by reflux-he is going to try omeprazole 20 mg -Due to smoking history though under 10 pack years I told patient if he did not improve that I would consider a chest x-ray within 3 to 4 weeks.  I think he overall is low risk for COVID-19 as no known exposures and trying to stay home for the most part.  Given this has largely  been an ongoing issue-I did not place him in self quarantine.  This would also allow me to consider x-ray in building-still likely would you side door and only having contact with our x-ray tech -Setting reminder to check on him in a month if we have not heard-asked him to communicate with [place] in 3 weeks  Future Appointments  Date Time Provider [place]  06/02/2018  8:15 AM Magnus Sinning, MD [facility] None   Lab/Order associations: Tickle in throat  Cough  Return precautions advised.  Garret Reddish, MD

## 2018-05-01 ENCOUNTER — Encounter: Payer: Self-pay | Admitting: Physical Medicine and Rehabilitation

## 2018-05-05 ENCOUNTER — Encounter: Payer: Self-pay | Admitting: Family Medicine

## 2018-05-05 NOTE — Telephone Encounter (Signed)
Please see message . Thank you .

## 2018-05-13 ENCOUNTER — Encounter: Payer: Self-pay | Admitting: Physical Medicine and Rehabilitation

## 2018-05-26 ENCOUNTER — Encounter: Payer: Self-pay | Admitting: Family Medicine

## 2018-05-27 MED ORDER — OMEPRAZOLE 40 MG PO CPDR: 40.0000 mg | DELAYED_RELEASE_CAPSULE | Freq: Every day | ORAL | 1 refills | Status: DC

## 2018-05-28 ENCOUNTER — Other Ambulatory Visit: Payer: Self-pay

## 2018-05-28 ENCOUNTER — Encounter

## 2018-05-28 ENCOUNTER — Ambulatory Visit: Payer: Self-pay

## 2018-05-28 ENCOUNTER — Ambulatory Visit (INDEPENDENT_AMBULATORY_CARE_PROVIDER_SITE_OTHER): Payer: BLUE CROSS/BLUE SHIELD | Admitting: Physical Medicine and Rehabilitation

## 2018-05-28 ENCOUNTER — Encounter: Payer: Self-pay | Admitting: Physical Medicine and Rehabilitation

## 2018-05-28 VITALS — BP 142/98 | HR 73 | Temp 98.0°F

## 2018-05-28 DIAGNOSIS — M5416 Radiculopathy, lumbar region: Secondary | ICD-10-CM | POA: Diagnosis not present

## 2018-05-28 MED ORDER — BETAMETHASONE SOD PHOS & ACET 6 (3-3) MG/ML IJ SUSP
12.0000 mg | Freq: Once | INTRAMUSCULAR | Status: AC
  Administered 2018-05-28: 12 mg

## 2018-05-28 NOTE — Procedures (Signed)
Lumbosacral Transforaminal Epidural Steroid Injection - Sub-Pedicular Approach with Fluoroscopic Guidance  Patient: Curtis Butler      Date of Birth: Sep 14, 1960 MRN: [id number] PCP: Marin Olp, MD      Visit Date: 05/28/2018   Universal Protocol:    Date/Time: 05/28/2018  Consent Given By: the patient  Position: PRONE  Additional Comments: Vital signs were monitored before and after the procedure. Patient was prepped and draped in the usual sterile fashion. The correct patient, procedure, and site was verified.   Injection Procedure Details:  Procedure Site One Meds Administered:  Meds ordered this encounter  Medications  . betamethasone acetate-betamethasone sodium phosphate (CELESTONE) injection 12 mg    Laterality: Left  Location/Site:  L4-L5  Needle size: 22 G  Needle type: Spinal  Needle Placement: Transforaminal  Findings:    -Comments: Excellent flow of contrast along the nerve and into the epidural space.  Procedure Details: After squaring off the end-plates to get a true AP view, the C-arm was positioned so that an oblique view of the foramen as noted above was visualized. The target area is just inferior to the "nose of the scotty dog" or sub pedicular. The soft tissues overlying this structure were infiltrated with 2-3 ml. of 1% Lidocaine without Epinephrine.  The spinal needle was inserted toward the target using a "trajectory" view along the fluoroscope beam.  Under AP and lateral visualization, the needle was advanced so it did not puncture dura and was located close the 6 O'Clock position of the pedical in AP tracterory. Biplanar projections were used to confirm position. Aspiration was confirmed to be negative for CSF and/or blood. A 1-2 ml. volume of Isovue-250 was injected and flow of contrast was noted at each level. Radiographs were obtained for documentation purposes.   After attaining the desired flow of contrast documented above, a  0.5 to 1.0 ml test dose of 0.25% Marcaine was injected into each respective transforaminal space.  The patient was observed for 90 seconds post injection.  After no sensory deficits were reported, and normal lower extremity motor function was noted,   the above injectate was administered so that equal amounts of the injectate were placed at each foramen (level) into the transforaminal epidural space.   Additional Comments:  The patient tolerated the procedure well Dressing: 2 x 2 sterile gauze and Band-Aid    Post-procedure details: Patient was observed during the procedure. Post-procedure instructions were reviewed.  Patient left the clinic in stable condition.

## 2018-05-28 NOTE — Progress Notes (Signed)
 .  Numeric Pain Rating Scale and Functional Assessment Average Pain 8   In the last MONTH (on 0-10 scale) has pain interfered with the following?  1. General activity like being  able to carry out your everyday physical activities such as walking, climbing stairs, carrying groceries, or moving a chair?  Rating(8)   +Driver, -BT, -Dye Allergies.  

## 2018-05-28 NOTE — Progress Notes (Signed)
Curtis Butler - 58 y.o. male MRN [id number]  Date of birth: 1960/07/15  Office Visit Note: Visit Date: 05/28/2018 PCP: Marin Olp, MD Referred by: Marin Olp, MD  Subjective: Chief Complaint  Patient presents with  . Lower Back - Injections   HPI:  Curtis Butler is a 58 y.o. male who comes in today For planned repeat left L4 transforaminal dural steroid injection for left hip and leg pain.  Prior injections of offered 3 to 4 months of relief.  Patient did see Dr. Sherley Bounds for neurosurgical evaluation and he suggested keeping up with the injection treatment for now unless something changed.  Patient does have foraminal disc protrusion with some listhesis at this level with supposed pars defects.  No other new symptoms no new weakness no red flag complaints.  ROS Otherwise per HPI.  Assessment & Plan: Visit Diagnoses:  1. Lumbar radiculopathy     Plan: No additional findings.   Meds & Orders:  Meds ordered this encounter  Medications  . betamethasone acetate-betamethasone sodium phosphate (CELESTONE) injection 12 mg    Orders Placed This Encounter  Procedures  . XR C-ARM NO REPORT  . Epidural Steroid injection    Follow-up: Return if symptoms worsen or fail to improve.   Procedures: No procedures performed  Lumbosacral Transforaminal Epidural Steroid Injection - Sub-Pedicular Approach with Fluoroscopic Guidance  Patient: Curtis Butler      Date of Birth: [birth year] MRN: [id number] PCP: Marin Olp, MD      Visit Date: 05/28/2018   Universal Protocol:    Date/Time: 05/28/2018  Consent Given By: the patient  Position: PRONE  Additional Comments: Vital signs were monitored before and after the procedure. Patient was prepped and draped in the usual sterile fashion. The correct patient, procedure, and site was verified.   Injection Procedure Details:  Procedure Site One Meds Administered:  Meds ordered this encounter   Medications  . betamethasone acetate-betamethasone sodium phosphate (CELESTONE) injection 12 mg    Laterality: Left  Location/Site:  L4-L5  Needle size: 22 G  Needle type: Spinal  Needle Placement: Transforaminal  Findings:    -Comments: Excellent flow of contrast along the nerve and into the epidural space.  Procedure Details: After squaring off the end-plates to get a true AP view, the C-arm was positioned so that an oblique view of the foramen as noted above was visualized. The target area is just inferior to the "nose of the scotty dog" or sub pedicular. The soft tissues overlying this structure were infiltrated with 2-3 ml. of 1% Lidocaine without Epinephrine.  The spinal needle was inserted toward the target using a "trajectory" view along the fluoroscope beam.  Under AP and lateral visualization, the needle was advanced so it did not puncture dura and was located close the 6 O'Clock position of the pedical in AP tracterory. Biplanar projections were used to confirm position. Aspiration was confirmed to be negative for CSF and/or blood. A 1-2 ml. volume of Isovue-250 was injected and flow of contrast was noted at each level. Radiographs were obtained for documentation purposes.   After attaining the desired flow of contrast documented above, a 0.5 to 1.0 ml test dose of 0.25% Marcaine was injected into each respective transforaminal space.  The patient was observed for 90 seconds post injection.  After no sensory deficits were reported, and normal lower extremity motor function was noted,   the above injectate was administered so that equal amounts of the injectate  were placed at each foramen (level) into the transforaminal epidural space.   Additional Comments:  The patient tolerated the procedure well Dressing: 2 x 2 sterile gauze and Band-Aid    Post-procedure details: Patient was observed during the procedure. Post-procedure instructions were reviewed.  Patient left the  clinic in stable condition.    Clinical History: MRI LUMBAR SPINE WITHOUT CONTRAST  TECHNIQUE: Multiplanar, multisequence MR imaging of the lumbar spine was performed. No intravenous contrast was administered.  COMPARISON:  Lumbar spine radiographs 07/29/2017.  FINDINGS: Segmentation: Conventional anatomy assumed, with the last open disc space designated L5-S1.  Alignment: There is 8 mm of anterolisthesis at L4-5. There is suspicion of underlying bilateral L4 pars defects (more convincing on the right). The alignment is otherwise normal.  Vertebrae: No evidence of acute fracture or focal osseous lesion. As above, suspected bilateral L4 pars defects. The visualized sacroiliac joints appear unremarkable.  Conus medullaris: Extends to the L1 level and appears normal.  Paraspinal and other soft tissues: No significant paraspinal findings.  Disc levels:  The discs are well hydrated with maintained height from T11-12 through L3-4. There is no spinal stenosis or nerve root encroachment. There is mild facet hypertrophy at L3-4.  L4-5: There is moderate loss of disc height with annular disc bulging and a broad-based left foraminal disc protrusion. There is significant left foraminal narrowing and left L4 nerve root encroachment. The right foramen is mildly narrowed. There is mild spinal stenosis with mild narrowing of the lateral recesses, left greater than right. As above, there is suspected bilateral L4 pars defects (more convincing on the right). In addition, there is moderately advanced facet hypertrophy.  L5-S1: Disc degeneration with a shallow left paracentral disc protrusion. There is mild mass effect on the thecal sac and possible encroachment on both S1 nerve roots. No significant foraminal compromise or L5 nerve root encroachment.  IMPRESSION: 1. The most significant findings are at L4-5 where there is advanced disc degeneration, a broad-based left  foraminal disc protrusion and 8 mm of anterolisthesis. There is left-greater-than-right foraminal narrowing with possible encroachment on both L4 nerve roots. 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 2. Shallow left paracentral disc protrusion at L5-S1 with potential bilateral S1 nerve root encroachment. 3. No other significant disc space findings.   Electronically Signed   By: Richardean Sale M.D.   On: 09/06/2017 12:12     Objective:  VS:  HT:    WT:   BMI:     BP:(!) 142/98  HR:73bpm  TEMP:98 F (36.7 C)( )  RESP:96 % Physical Exam  Ortho Exam Imaging: Xr C-arm No Report  Result Date: 05/28/2018 Please see Notes tab for imaging impression.

## 2018-05-31 ENCOUNTER — Ambulatory Visit: Payer: Self-pay

## 2018-05-31 ENCOUNTER — Other Ambulatory Visit: Payer: BLUE CROSS/BLUE SHIELD

## 2018-05-31 ENCOUNTER — Telehealth: Payer: Self-pay

## 2018-05-31 ENCOUNTER — Telehealth: Payer: Self-pay | Admitting: *Deleted

## 2018-05-31 DIAGNOSIS — Z20822 Contact with and (suspected) exposure to covid-19: Secondary | ICD-10-CM

## 2018-05-31 NOTE — Telephone Encounter (Signed)
Telephone call to Patient to offer Free Covid-19 testing.  Left message to return call [phone]

## 2018-05-31 NOTE — Telephone Encounter (Signed)
Contacted pt; regarding recent ortho appointment; explained potential exposure to COVID; the pt would like testing; he denies fever, cough, or SOB; pt offered and accepted appointment at [facility] site 05/30/3028 at 1115; pt given address, directions, and instruction that he and all occupants of his vehicle should wear a mask; he verbalizes understanding; orders placed per protocol.

## 2018-05-31 NOTE — Telephone Encounter (Signed)
Attempted to contact pt; left message on voicemail. 

## 2018-05-31 NOTE — Addendum Note (Signed)
Addended by: Addison Naegeli on: 05/31/2018 09:47 AM   Modules accepted: Orders

## 2018-06-02 ENCOUNTER — Encounter: Payer: Self-pay | Admitting: Physical Medicine and Rehabilitation

## 2018-06-02 ENCOUNTER — Telehealth: Payer: Self-pay | Admitting: Family Medicine

## 2018-06-02 LAB — NOVEL CORONAVIRUS, NAA: SARS-CoV-2, NAA: NOT DETECTED

## 2018-06-02 NOTE — Telephone Encounter (Signed)
Noted.   Routing to PCP as FYI.

## 2018-06-02 NOTE — Telephone Encounter (Signed)
Message routed to PCP.

## 2018-06-02 NOTE — Telephone Encounter (Signed)
Looks like he was tested- exposure did not occur at [employer]

## 2018-06-02 NOTE — Telephone Encounter (Signed)
[place] [facility] [place] Patient Name: Curtis Butler Gender: Male DOB: 04-Nov-1960 Age: 58 Y 59 M 14 D Return Phone Number: [phone] (Primary) Address: City/State/Zip: [place] [address] Client [facility] Client Site [place] [facility] [place] Night Physician Garret Reddish- MD Contact Type Call Who Is Calling Patient / Member / Family / Caregiver Call Type Triage / Clinical Relationship To Patient Self Return Phone Number [phone] (Primary) Chief Complaint Infection Exposure (non-symptomatic) Reason for Call Symptomatic / Request for [place] returning a missed call with a message to call back. The message said that he had possibly been exposed to COVID at his appt last week and to call. Denies any sx. Translation No Nurse Assessment Nurse: Markus Daft, RN, [place] Date/Time Eilene Ghazi Time): 05/31/2018 12:25:05 PM Confirm and document reason for call. If symptomatic, describe symptoms. ---Caller states that he has no s/s. He had a message said that he had possibly been exposed to COVID at his appt last week and to call. Has the patient had close contact with a person known or suspected to have the novel coronavirus illness OR traveled / lives in area with major community spread (including international travel) in the last 14 days from the onset of symptoms? * If Asymptomatic, screen for exposure and travel within the last 14 days. ---Yes Does the patient have any new or worsening symptoms? ---No Please document clinical information provided and list any resource used. ---Caller states that he has already been contacted and had his COVID testing done. RN noted and advised that we are here 24/7 if needed again or to discuss s/s. Caller verbalized understanding. Guidelines Guideline Title Affirmed Question  Affirmed Notes Nurse Date/Time (Eastern Time) Disp. Time Eilene Ghazi Time) Disposition Final User 05/31/2018 12:21:12 PM Send To RN Personal Thurmond Butts, RN, Kirke Shaggy 05/31/2018 12:26:46 PM Clinical Call Yes Markus Daft, RN, Sherre Poot

## 2018-06-19 ENCOUNTER — Other Ambulatory Visit: Payer: Self-pay | Admitting: Family Medicine

## 2018-06-19 ENCOUNTER — Encounter: Payer: Self-pay | Admitting: Family Medicine

## 2018-06-23 ENCOUNTER — Other Ambulatory Visit: Payer: Self-pay | Admitting: Physical Therapy

## 2018-06-23 DIAGNOSIS — K219 Gastro-esophageal reflux disease without esophagitis: Secondary | ICD-10-CM

## 2018-06-24 ENCOUNTER — Ambulatory Visit: Payer: BLUE CROSS/BLUE SHIELD | Admitting: Family Medicine

## 2018-06-24 DIAGNOSIS — K219 Gastro-esophageal reflux disease without esophagitis: Secondary | ICD-10-CM | POA: Diagnosis not present

## 2018-06-24 DIAGNOSIS — J300 Vasomotor rhinitis: Secondary | ICD-10-CM | POA: Diagnosis not present

## 2018-06-24 DIAGNOSIS — R1313 Dysphagia, pharyngeal phase: Secondary | ICD-10-CM | POA: Diagnosis not present

## 2018-07-13 ENCOUNTER — Encounter: Payer: Self-pay | Admitting: Family Medicine

## 2018-07-14 ENCOUNTER — Telehealth: Payer: Self-pay | Admitting: Physical Medicine and Rehabilitation

## 2018-07-14 NOTE — Telephone Encounter (Signed)
ok 

## 2018-07-15 NOTE — Telephone Encounter (Signed)
Scheduled for 7/30. Is auth needed for [phone]?

## 2018-07-15 NOTE — Telephone Encounter (Signed)
Spoke with Sharon Mt and she states no pa is needed for [phone] Reference# [id number] [place]

## 2018-07-31 ENCOUNTER — Ambulatory Visit: Payer: Self-pay

## 2018-07-31 ENCOUNTER — Ambulatory Visit (INDEPENDENT_AMBULATORY_CARE_PROVIDER_SITE_OTHER): Payer: BC Managed Care – PPO | Admitting: Physical Medicine and Rehabilitation

## 2018-07-31 ENCOUNTER — Encounter: Payer: Self-pay | Admitting: Physical Medicine and Rehabilitation

## 2018-07-31 ENCOUNTER — Other Ambulatory Visit: Payer: Self-pay

## 2018-07-31 VITALS — BP 127/67 | HR 42

## 2018-07-31 DIAGNOSIS — M5116 Intervertebral disc disorders with radiculopathy, lumbar region: Secondary | ICD-10-CM | POA: Diagnosis not present

## 2018-07-31 DIAGNOSIS — M5416 Radiculopathy, lumbar region: Secondary | ICD-10-CM | POA: Diagnosis not present

## 2018-07-31 DIAGNOSIS — M4316 Spondylolisthesis, lumbar region: Secondary | ICD-10-CM | POA: Diagnosis not present

## 2018-07-31 DIAGNOSIS — Q762 Congenital spondylolisthesis: Secondary | ICD-10-CM

## 2018-07-31 MED ORDER — BETAMETHASONE SOD PHOS & ACET 6 (3-3) MG/ML IJ SUSP
12.0000 mg | Freq: Once | INTRAMUSCULAR | Status: AC
  Administered 2018-07-31: 12 mg

## 2018-07-31 NOTE — Progress Notes (Signed)
.  Numeric Pain Rating Scale and Functional Assessment Average Pain 9   In the last MONTH (on 0-10 scale) has pain interfered with the following?  1. General activity like being  able to carry out your everyday physical activities such as walking, climbing stairs, carrying groceries, or moving a chair?  Rating(8)   +Driver, -BT, -Dye Allergies.  

## 2018-07-31 NOTE — Procedures (Signed)
Lumbosacral Transforaminal Epidural Steroid Injection - Sub-Pedicular Approach with Fluoroscopic Guidance  Patient: Curtis Butler      Date of Birth: 12-Mar-1960 MRN: [id number] PCP: Marin Olp, MD      Visit Date: 07/31/2018   Universal Protocol:    Date/Time: 07/31/2018  Consent Given By: the patient  Position: PRONE  Additional Comments: Vital signs were monitored before and after the procedure. Patient was prepped and draped in the usual sterile fashion. The correct patient, procedure, and site was verified.   Injection Procedure Details:  Procedure Site One Meds Administered:  Meds ordered this encounter  Medications  . betamethasone acetate-betamethasone sodium phosphate (CELESTONE) injection 12 mg    Laterality: Left  Location/Site:  L4-L5  Needle size: 22 G  Needle type: Spinal  Needle Placement: Transforaminal  Findings:    -Comments: Excellent flow of contrast along the nerve and into the epidural space.  Procedure Details: After squaring off the end-plates to get a true AP view, the C-arm was positioned so that an oblique view of the foramen as noted above was visualized. The target area is just inferior to the "nose of the scotty dog" or sub pedicular. The soft tissues overlying this structure were infiltrated with 2-3 ml. of 1% Lidocaine without Epinephrine.  The spinal needle was inserted toward the target using a "trajectory" view along the fluoroscope beam.  Under AP and lateral visualization, the needle was advanced so it did not puncture dura and was located close the 6 O'Clock position of the pedical in AP tracterory. Biplanar projections were used to confirm position. Aspiration was confirmed to be negative for CSF and/or blood. A 1-2 ml. volume of Isovue-250 was injected and flow of contrast was noted at each level. Radiographs were obtained for documentation purposes.   After attaining the desired flow of contrast documented above, a  0.5 to 1.0 ml test dose of 0.25% Marcaine was injected into each respective transforaminal space.  The patient was observed for 90 seconds post injection.  After no sensory deficits were reported, and normal lower extremity motor function was noted,   the above injectate was administered so that equal amounts of the injectate were placed at each foramen (level) into the transforaminal epidural space.   Additional Comments:  The patient tolerated the procedure well Dressing: 2 x 2 sterile gauze and Band-Aid    Post-procedure details: Patient was observed during the procedure. Post-procedure instructions were reviewed.  Patient left the clinic in stable condition.

## 2018-07-31 NOTE — Progress Notes (Addendum)
Curtis Butler - 59 y.o. male MRN [id number]  Date of birth: 01-06-1960  Office Visit Note: Visit Date: 07/31/2018 PCP: Marin Olp, MD Referred by: Marin Olp, MD  Subjective: Chief Complaint  Patient presents with  . Lower Back - Pain  . Left Leg - Pain   HPI: Curtis Butler is a 58 y.o. male who comes in today For planned repeat left L4 transforaminal epidural steroid injection.  Prior injection at the end of May gave him quite a bit of relief but was not very long lasting.  Initial injections in September of last year did last quite a bit longer.  His case is really complicated by the fact that he has a grade 1 significant spondylolisthesis of L4 on L5 with bilateral pars defects without much in the way of central canal narrowing.  The left L4 foramen is very tight.  He has very small left paracentral disc protrusion L5-S1 which could affect the S1 nerve root but no focal compression.  He is always done better with the L4 injection although consideration would be given to S1 injection.  He is at a point where he is very much considering surgery through Dr. Sherley Bounds.  I think at this point trying to help him with some pain relief we will go ahead today and repeat the L4 injection because it has helped him and it would be diagnostic for Dr. Ronnald Ramp as well.  He has had no focal weakness.  No bowel or bladder symptoms no red flag complaints.  He reports sitting for a long time or standing is very painful.  He really is at a point where it is bothering him quite a bit.  He discussed with me today why Dr. Wynetta Emery said just keep doing injections versus surgery and we did have a long talk about the different aspects of both plans of treatment and really just depends on how well the patient is doing with the injection for what length of time and sort of what their thoughts are.  Review of Systems  Constitutional: Negative for chills, fever, malaise/fatigue and weight loss.   HENT: Negative for hearing loss and sinus pain.   Eyes: Negative for blurred vision, double vision and photophobia.  Respiratory: Negative for cough and shortness of breath.   Cardiovascular: Negative for chest pain, palpitations and leg swelling.  Gastrointestinal: Negative for abdominal pain, nausea and vomiting.  Genitourinary: Negative for flank pain.  Musculoskeletal: Positive for back pain. Negative for myalgias.       Left hip and leg pain  Skin: Negative for itching and rash.  Neurological: Negative for tremors, focal weakness and weakness.  Endo/Heme/Allergies: Negative.   Psychiatric/Behavioral: Negative for depression.  All other systems reviewed and are negative.  Otherwise per HPI.  Assessment & Plan: Visit Diagnoses:  1. Lumbar radiculopathy   2. Spondylolisthesis of lumbar region   3. Radiculopathy due to lumbar intervertebral disc disorder   4. Congenital spondylolysis     Plan: Findings:  I spoke at length today greater than 20 minutes about the patient's spine condition symptoms and injections versus surgery.  Today we did decide to complete left L4 transforaminal injection once again.  There is potential that there is small disc protrusion at L5-S1 causing symptoms but I think he is done so well diagnostically at L4 that is the main culprit.  She does have more significant findings at that level with bilateral pars defects and spondylolisthesis.  Likely surgical procedure  would be some type of lumbar fusion given the listhesis.  I think the patient would do well with the fact that he gets mostly buttock and hip pain and not so much just plain back pain.  He is going to follow-up with Dr. Ronnald Ramp.  We will see how well the injection dose today.    Meds & Orders:  Meds ordered this encounter  Medications  . betamethasone acetate-betamethasone sodium phosphate (CELESTONE) injection 12 mg    Orders Placed This Encounter  Procedures  . XR C-ARM NO REPORT  . Epidural  Steroid injection    Follow-up: Return if symptoms worsen or fail to improve.   Procedures: No procedures performed  Lumbosacral Transforaminal Epidural Steroid Injection - Sub-Pedicular Approach with Fluoroscopic Guidance  Patient: Curtis Butler      Date of Birth: 1960/08/04 MRN: [id number] PCP: Marin Olp, MD      Visit Date: 07/31/2018   Universal Protocol:    Date/Time: 07/31/2018  Consent Given By: the patient  Position: PRONE  Additional Comments: Vital signs were monitored before and after the procedure. Patient was prepped and draped in the usual sterile fashion. The correct patient, procedure, and site was verified.   Injection Procedure Details:  Procedure Site One Meds Administered:  Meds ordered this encounter  Medications  . betamethasone acetate-betamethasone sodium phosphate (CELESTONE) injection 12 mg    Laterality: Left  Location/Site:  L4-L5  Needle size: 22 G  Needle type: Spinal  Needle Placement: Transforaminal  Findings:    -Comments: Excellent flow of contrast along the nerve and into the epidural space.  Procedure Details: After squaring off the end-plates to get a true AP view, the C-arm was positioned so that an oblique view of the foramen as noted above was visualized. The target area is just inferior to the "nose of the scotty dog" or sub pedicular. The soft tissues overlying this structure were infiltrated with 2-3 ml. of 1% Lidocaine without Epinephrine.  The spinal needle was inserted toward the target using a "trajectory" view along the fluoroscope beam.  Under AP and lateral visualization, the needle was advanced so it did not puncture dura and was located close the 6 O'Clock position of the pedical in AP tracterory. Biplanar projections were used to confirm position. Aspiration was confirmed to be negative for CSF and/or blood. A 1-2 ml. volume of Isovue-250 was injected and flow of contrast was noted at each level.  Radiographs were obtained for documentation purposes.   After attaining the desired flow of contrast documented above, a 0.5 to 1.0 ml test dose of 0.25% Marcaine was injected into each respective transforaminal space.  The patient was observed for 90 seconds post injection.  After no sensory deficits were reported, and normal lower extremity motor function was noted,   the above injectate was administered so that equal amounts of the injectate were placed at each foramen (level) into the transforaminal epidural space.   Additional Comments:  The patient tolerated the procedure well Dressing: 2 x 2 sterile gauze and Band-Aid    Post-procedure details: Patient was observed during the procedure. Post-procedure instructions were reviewed.  Patient left the clinic in stable condition.     Clinical History: MRI LUMBAR SPINE WITHOUT CONTRAST  TECHNIQUE: Multiplanar, multisequence MR imaging of the lumbar spine was performed. No intravenous contrast was administered.  COMPARISON:  Lumbar spine radiographs 07/29/2017.  FINDINGS: Segmentation: Conventional anatomy assumed, with the last open disc space designated L5-S1.  Alignment: There is 8  mm of anterolisthesis at L4-5. There is suspicion of underlying bilateral L4 pars defects (more convincing on the right). The alignment is otherwise normal.  Vertebrae: No evidence of acute fracture or focal osseous lesion. As above, suspected bilateral L4 pars defects. The visualized sacroiliac joints appear unremarkable.  Conus medullaris: Extends to the L1 level and appears normal.  Paraspinal and other soft tissues: No significant paraspinal findings.  Disc levels:  The discs are well hydrated with maintained height from T11-12 through L3-4. There is no spinal stenosis or nerve root encroachment. There is mild facet hypertrophy at L3-4.  L4-5: There is moderate loss of disc height with annular disc bulging and a  broad-based left foraminal disc protrusion. There is significant left foraminal narrowing and left L4 nerve root encroachment. The right foramen is mildly narrowed. There is mild spinal stenosis with mild narrowing of the lateral recesses, left greater than right. As above, there is suspected bilateral L4 pars defects (more convincing on the right). In addition, there is moderately advanced facet hypertrophy.  L5-S1: Disc degeneration with a shallow left paracentral disc protrusion. There is mild mass effect on the thecal sac and possible encroachment on both S1 nerve roots. No significant foraminal compromise or L5 nerve root encroachment.  IMPRESSION: 1. The most significant findings are at L4-5 where there is advanced disc degeneration, a broad-based left foraminal disc protrusion and 8 mm of anterolisthesis. There is left-greater-than-right foraminal narrowing with possible encroachment on both L4 nerve roots. 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 2. Shallow left paracentral disc protrusion at L5-S1 with potential bilateral S1 nerve root encroachment. 3. No other significant disc space findings.   Electronically Signed   By: Richardean Sale M.D.   On: 09/06/2017 12:12   He reports that he quit smoking about 22 months ago. His smoking use included cigarettes. He has a 8.75 pack-year smoking history. He has never used smokeless tobacco. No results for input(s): HGBA1C, LABURIC in the last 8760 hours.  Objective:  VS:  HT:    WT:   BMI:     BP:127/67  HR:(!) 42bpm  TEMP: ( )  RESP:  Physical Exam Vitals signs and nursing note reviewed.  Constitutional:      General: He is not in acute distress.    Appearance: Normal appearance. He is well-developed.  HENT:     Head: Normocephalic and atraumatic.  Eyes:     Conjunctiva/sclera: Conjunctivae normal.     Pupils: Pupils are equal, round, and reactive to light.  Neck:     Musculoskeletal: Normal range of  motion and neck supple. No neck rigidity.  Cardiovascular:     Rate and Rhythm: Normal rate.     Pulses: Normal pulses.     Heart sounds: Normal heart sounds.  Pulmonary:     Effort: Pulmonary effort is normal. No respiratory distress.  Musculoskeletal:     Right lower leg: No edema.     Left lower leg: No edema.     Comments: Lumbar spine exam shows the patient with a forward flexed lumbar spine pain with extension and facet loading no pain with hip rotation no pain over the trochanters she has good distal strength.  He has a negative slump test bilaterally.  Skin:    General: Skin is warm and dry.     Findings: No erythema or rash.  Neurological:     General: No focal deficit present.     Mental Status: He is alert and oriented to  person, place, and time.     Sensory: No sensory deficit.     Coordination: Coordination normal.     Gait: Gait normal.  Psychiatric:        Mood and Affect: Mood normal.        Behavior: Behavior normal.     Ortho Exam Imaging: No results found.  Past Medical/Family/Surgical/Social History: Medications & Allergies reviewed per EMR, new medications updated. Patient Active Problem List   Diagnosis Date Noted  . Hyperglycemia 11/05/2016  . Vitamin D deficiency 10/31/2016  . Allergic rhinitis 10/31/2016  . Former smoker 10/31/2016  . History of colon polyps 10/31/2016  . Low back pain   . Hyperlipidemia   . GERD (gastroesophageal reflux disease)    Past Medical History:  Diagnosis Date  . GERD (gastroesophageal reflux disease)    prilosec otc sparingly  . Hyperlipidemia    pravastatin 40mg   . Low back pain    left low back into left leg. slipped disc he believes at L3-L4   Family History  Problem Relation Age of Onset  . Dementia Mother        62 in 2018- at friends home  . Hypertension Mother   . Heart disease Father        MI at 19 and killed him. didnt get medical care, cigar smoker  . Healthy Sister   . Healthy Daughter   .  Healthy Son   . Cancer Maternal Grandmother        unknown  . Bone cancer Paternal Grandfather        unknown original source   Past Surgical History:  Procedure Laterality Date  . VASECTOMY     Social History   Occupational History  . Not on file  Tobacco Use  . Smoking status: Former Smoker    Packs/day: 0.25    Years: 35.00    Pack years: 8.75    Types: Cigarettes    Quit date: 11/01/2016    Years since quitting: 1.8  . Smokeless tobacco: Never Used  Substance and Sexual Activity  . Alcohol use: Yes    Comment: 4-5 per week  . Drug use: No  . Sexual activity: Yes

## 2018-08-14 DIAGNOSIS — M4316 Spondylolisthesis, lumbar region: Secondary | ICD-10-CM | POA: Diagnosis not present

## 2018-08-26 DIAGNOSIS — M48061 Spinal stenosis, lumbar region without neurogenic claudication: Secondary | ICD-10-CM | POA: Diagnosis not present

## 2018-08-26 DIAGNOSIS — M4316 Spondylolisthesis, lumbar region: Secondary | ICD-10-CM | POA: Diagnosis not present

## 2018-08-26 DIAGNOSIS — M5127 Other intervertebral disc displacement, lumbosacral region: Secondary | ICD-10-CM | POA: Diagnosis not present

## 2018-08-28 DIAGNOSIS — M4316 Spondylolisthesis, lumbar region: Secondary | ICD-10-CM | POA: Diagnosis not present

## 2018-09-10 ENCOUNTER — Other Ambulatory Visit: Payer: Self-pay | Admitting: Neurological Surgery

## 2018-09-11 ENCOUNTER — Other Ambulatory Visit: Payer: Self-pay | Admitting: *Deleted

## 2018-09-12 ENCOUNTER — Telehealth: Payer: Self-pay | Admitting: Physical Medicine and Rehabilitation

## 2018-09-12 NOTE — Telephone Encounter (Signed)
Did last help more than 50% relief and has the patient followed up with Dr. Ronnald Ramp. See last note from me

## 2018-09-12 NOTE — Telephone Encounter (Signed)
Patient reports about 90% relief from last injection. He has surgery scheduled with Dr. Ronnald Ramp for the end of October, but her states that Dr. Ronnald Ramp told him it would be ok to have another injection if he needed it.

## 2018-09-15 ENCOUNTER — Encounter: Payer: Self-pay | Admitting: Physical Medicine and Rehabilitation

## 2018-09-15 NOTE — Telephone Encounter (Signed)
Go ahead and schedule repeat

## 2018-09-15 NOTE — Telephone Encounter (Signed)
Spoke with Elyn Aquas from [facility] for [phone] prior Josem Kaufmann is not required. Reference [id number]         Pt is scheduled 09/29/2018 with driver.

## 2018-09-29 ENCOUNTER — Ambulatory Visit (INDEPENDENT_AMBULATORY_CARE_PROVIDER_SITE_OTHER): Payer: BC Managed Care – PPO | Admitting: Physical Medicine and Rehabilitation

## 2018-09-29 ENCOUNTER — Encounter: Payer: Self-pay | Admitting: Physical Medicine and Rehabilitation

## 2018-09-29 ENCOUNTER — Ambulatory Visit: Payer: Self-pay

## 2018-09-29 ENCOUNTER — Encounter

## 2018-09-29 VITALS — BP 139/91 | HR 67

## 2018-09-29 DIAGNOSIS — M5416 Radiculopathy, lumbar region: Secondary | ICD-10-CM

## 2018-09-29 DIAGNOSIS — M5116 Intervertebral disc disorders with radiculopathy, lumbar region: Secondary | ICD-10-CM

## 2018-09-29 MED ORDER — BETAMETHASONE SOD PHOS & ACET 6 (3-3) MG/ML IJ SUSP
12.0000 mg | Freq: Once | INTRAMUSCULAR | Status: AC
  Administered 2018-09-29: 12 mg

## 2018-09-29 NOTE — Progress Notes (Signed)
Curtis Butler - 58 y.o. male MRN [id number]  Date of birth: [birth year]  Office Visit Note: Visit Date: 09/29/2018 PCP: Marin Olp, MD Referred by: Marin Olp, MD  Subjective: Chief Complaint  Patient presents with  . Lower Back - Pain   HPI:  Curtis Butler is a 58 y.o. male who comes in today For planned diagnostic and hopefully therapeutic L4 transforaminal epidural steroid injection.  Please see our prior notes for further details and justification.  Patient is having left low back pain with radicular symptoms.  He is actually looking at possibly having surgery by Dr. Ronnald Ramp in the near future.  Prior injections have helped for a few months.  ROS Otherwise per HPI.  Assessment & Plan: Visit Diagnoses:  1. Lumbar radiculopathy   2. Radiculopathy due to lumbar intervertebral disc disorder     Plan: No additional findings.   Meds & Orders:  Meds ordered this encounter  Medications  . betamethasone acetate-betamethasone sodium phosphate (CELESTONE) injection 12 mg    Orders Placed This Encounter  Procedures  . XR C-ARM NO REPORT  . Epidural Steroid injection    Follow-up: Return if symptoms worsen or fail to improve, for Sherley Bounds, MD.   Procedures: No procedures performed  Lumbosacral Transforaminal Epidural Steroid Injection - Sub-Pedicular Approach with Fluoroscopic Guidance  Patient: Curtis Butler      Date of Birth: 1960-07-14 MRN: [id number] PCP: Marin Olp, MD      Visit Date: 09/29/2018   Universal Protocol:    Date/Time: 09/29/2018  Consent Given By: the patient  Position: PRONE  Additional Comments: Vital signs were monitored before and after the procedure. Patient was prepped and draped in the usual sterile fashion. The correct patient, procedure, and site was verified.   Injection Procedure Details:  Procedure Site One Meds Administered:  Meds ordered this encounter  Medications  . betamethasone  acetate-betamethasone sodium phosphate (CELESTONE) injection 12 mg    Laterality: Left  Location/Site:  L4-L5  Needle size: 22 G  Needle type: Spinal  Needle Placement: Transforaminal  Findings:    -Comments: Excellent flow of contrast along the nerve and into the epidural space.  Procedure Details: After squaring off the end-plates to get a true AP view, the C-arm was positioned so that an oblique view of the foramen as noted above was visualized. The target area is just inferior to the "nose of the scotty dog" or sub pedicular. The soft tissues overlying this structure were infiltrated with 2-3 ml. of 1% Lidocaine without Epinephrine.  The spinal needle was inserted toward the target using a "trajectory" view along the fluoroscope beam.  Under AP and lateral visualization, the needle was advanced so it did not puncture dura and was located close the 6 O'Clock position of the pedical in AP tracterory. Biplanar projections were used to confirm position. Aspiration was confirmed to be negative for CSF and/or blood. A 1-2 ml. volume of Isovue-250 was injected and flow of contrast was noted at each level. Radiographs were obtained for documentation purposes.   After attaining the desired flow of contrast documented above, a 0.5 to 1.0 ml test dose of 0.25% Marcaine was injected into each respective transforaminal space.  The patient was observed for 90 seconds post injection.  After no sensory deficits were reported, and normal lower extremity motor function was noted,   the above injectate was administered so that equal amounts of the injectate were placed at each foramen (level) into  the transforaminal epidural space.   Additional Comments:  The patient tolerated the procedure well Dressing: 2 x 2 sterile gauze and Band-Aid    Post-procedure details: Patient was observed during the procedure. Post-procedure instructions were reviewed.  Patient left the clinic in stable condition.      Clinical History: MRI LUMBAR SPINE WITHOUT CONTRAST  TECHNIQUE: Multiplanar, multisequence MR imaging of the lumbar spine was performed. No intravenous contrast was administered.  COMPARISON:  Lumbar spine radiographs 07/29/2017.  FINDINGS: Segmentation: Conventional anatomy assumed, with the last open disc space designated L5-S1.  Alignment: There is 8 mm of anterolisthesis at L4-5. There is suspicion of underlying bilateral L4 pars defects (more convincing on the right). The alignment is otherwise normal.  Vertebrae: No evidence of acute fracture or focal osseous lesion. As above, suspected bilateral L4 pars defects. The visualized sacroiliac joints appear unremarkable.  Conus medullaris: Extends to the L1 level and appears normal.  Paraspinal and other soft tissues: No significant paraspinal findings.  Disc levels:  The discs are well hydrated with maintained height from T11-12 through L3-4. There is no spinal stenosis or nerve root encroachment. There is mild facet hypertrophy at L3-4.  L4-5: There is moderate loss of disc height with annular disc bulging and a broad-based left foraminal disc protrusion. There is significant left foraminal narrowing and left L4 nerve root encroachment. The right foramen is mildly narrowed. There is mild spinal stenosis with mild narrowing of the lateral recesses, left greater than right. As above, there is suspected bilateral L4 pars defects (more convincing on the right). In addition, there is moderately advanced facet hypertrophy.  L5-S1: Disc degeneration with a shallow left paracentral disc protrusion. There is mild mass effect on the thecal sac and possible encroachment on both S1 nerve roots. No significant foraminal compromise or L5 nerve root encroachment.  IMPRESSION: 1. The most significant findings are at L4-5 where there is advanced disc degeneration, a broad-based left foraminal disc protrusion and 8  mm of anterolisthesis. There is left-greater-than-right foraminal narrowing with possible encroachment on both L4 nerve roots. 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 2. Shallow left paracentral disc protrusion at L5-S1 with potential bilateral S1 nerve root encroachment. 3. No other significant disc space findings.   Electronically Signed   By: Richardean Sale M.D.   On: 09/06/2017 12:12     Objective:  VS:  HT:    WT:   BMI:     BP:(!) 139/91  HR:67bpm  TEMP: ( )  RESP:  Physical Exam  Ortho Exam Imaging: Xr C-arm No Report  Result Date: 09/29/2018 Please see Notes tab for imaging impression.

## 2018-09-29 NOTE — Progress Notes (Signed)
  Numeric Pain Rating Scale and Functional Assessment Average Pain 9   In the last MONTH (on 0-10 scale) has pain interfered with the following?  1. General activity like being  able to carry out your everyday physical activities such as walking, climbing stairs, carrying groceries, or moving a chair?  Rating(10)   +Driver, -BT, -Dye Allergies.

## 2018-09-30 NOTE — Procedures (Signed)
Lumbosacral Transforaminal Epidural Steroid Injection - Sub-Pedicular Approach with Fluoroscopic Guidance  Patient: Curtis Butler      Date of Birth: [birth year] MRN: [id number] PCP: Marin Olp, MD      Visit Date: 09/29/2018   Universal Protocol:    Date/Time: 09/29/2018  Consent Given By: the patient  Position: PRONE  Additional Comments: Vital signs were monitored before and after the procedure. Patient was prepped and draped in the usual sterile fashion. The correct patient, procedure, and site was verified.   Injection Procedure Details:  Procedure Site One Meds Administered:  Meds ordered this encounter  Medications  . betamethasone acetate-betamethasone sodium phosphate (CELESTONE) injection 12 mg    Laterality: Left  Location/Site:  L4-L5  Needle size: 22 G  Needle type: Spinal  Needle Placement: Transforaminal  Findings:    -Comments: Excellent flow of contrast along the nerve and into the epidural space.  Procedure Details: After squaring off the end-plates to get a true AP view, the C-arm was positioned so that an oblique view of the foramen as noted above was visualized. The target area is just inferior to the "nose of the scotty dog" or sub pedicular. The soft tissues overlying this structure were infiltrated with 2-3 ml. of 1% Lidocaine without Epinephrine.  The spinal needle was inserted toward the target using a "trajectory" view along the fluoroscope beam.  Under AP and lateral visualization, the needle was advanced so it did not puncture dura and was located close the 6 O'Clock position of the pedical in AP tracterory. Biplanar projections were used to confirm position. Aspiration was confirmed to be negative for CSF and/or blood. A 1-2 ml. volume of Isovue-250 was injected and flow of contrast was noted at each level. Radiographs were obtained for documentation purposes.   After attaining the desired flow of contrast documented above, a  0.5 to 1.0 ml test dose of 0.25% Marcaine was injected into each respective transforaminal space.  The patient was observed for 90 seconds post injection.  After no sensory deficits were reported, and normal lower extremity motor function was noted,   the above injectate was administered so that equal amounts of the injectate were placed at each foramen (level) into the transforaminal epidural space.   Additional Comments:  The patient tolerated the procedure well Dressing: 2 x 2 sterile gauze and Band-Aid    Post-procedure details: Patient was observed during the procedure. Post-procedure instructions were reviewed.  Patient left the clinic in stable condition.

## 2018-10-14 ENCOUNTER — Other Ambulatory Visit: Payer: Self-pay

## 2018-10-14 ENCOUNTER — Ambulatory Visit (INDEPENDENT_AMBULATORY_CARE_PROVIDER_SITE_OTHER): Payer: BC Managed Care – PPO | Admitting: Vascular Surgery

## 2018-10-14 ENCOUNTER — Encounter: Payer: Self-pay | Admitting: Vascular Surgery

## 2018-10-14 VITALS — BP 130/93 | HR 75 | Temp 97.6°F | Resp 20 | Ht 66.5 in | Wt 167.7 lb

## 2018-10-14 DIAGNOSIS — M5136 Other intervertebral disc degeneration, lumbar region: Secondary | ICD-10-CM

## 2018-10-14 NOTE — Progress Notes (Signed)
[facility]  Patient name: Curtis Butler MRN: [id number] DOB: 14-Jul-1960 Sex: male  REASON FOR CONSULT: Discussed anterior exposure for L4-5 surgery  HPI: Curtis Butler is a 58 y.o. male, who is today for discussion of anterior exposure for L4-5 disc surgery.  He has seen Dr. Sherley Bounds for evaluation of his degenerative disc disease.  He reports this has become quite limiting to him.  He has low back pain and also pain in his left buttocks extending down into his calf.  He reports that he has had injections for treatment on 6 different occasions and has short period of improvement but then recurrence of disease.  He is now planned for anterior and posterior fusion.  He is otherwise healthy.  Has no history of cardiac disease disease or peripheral vascular occlusive disease  Past Medical History:  Diagnosis Date  . GERD (gastroesophageal reflux disease)    prilosec otc sparingly  . Hyperlipidemia    pravastatin 40mg   . Low back pain    left low back into left leg. slipped disc he believes at L3-L4  . Lumbar foraminal stenosis    Bilateral  . Protrusion of lumbar intervertebral disc    Broad-based at L5-S1.  Marland Kitchen Spondylolisthesis, lumbar region    lytic at L4-L5.    Family History  Problem Relation Age of Onset  . Dementia Mother        90 in 2018- at friends home  . Hypertension Mother   . Heart disease Father        MI at 48 and killed him. didnt get medical care, cigar smoker  . Healthy Sister   . Healthy Daughter   . Healthy Son   . Cancer Maternal Grandmother        unknown  . Bone cancer Paternal Grandfather        unknown original source    SOCIAL HISTORY: Social History   Socioeconomic History  . Marital status: Married    Spouse name: Not on file  . Number of children: Not on file  . Years of education: Not on file  . Highest education level: Not on file  Occupational History  .  Not on file  Social Needs  . Financial resource strain: Not on file  . Food insecurity    Worry: Not on file    Inability: Not on file  . Transportation needs    Medical: Not on file    Non-medical: Not on file  Tobacco Use  . Smoking status: Former Smoker    Packs/day: 0.25    Years: 35.00    Pack years: 8.75    Types: Cigarettes    Quit date: 11/01/2016    Years since quitting: 1.9  . Smokeless tobacco: Never Used  Substance and Sexual Activity  . Alcohol use: Yes    Comment: 4-5 per week  . Drug use: No  . Sexual activity: Yes  Lifestyle  . Physical activity    Days per week: Not on file    Minutes per session: Not on file  . Stress: Not on file  Relationships  . Social Herbalist on phone: Not on file    Gets together: Not on file    Attends religious service: Not on file    Active member of club or organization: Not on file    Attends meetings of clubs or organizations: Not on file    Relationship status: Not  on file  . Intimate partner violence    Fear of current or ex partner: Not on file    Emotionally abused: Not on file    Physically abused: Not on file    Forced sexual activity: Not on file  Other Topics Concern  . Not on file  Social History Narrative   Married 1989. 2 children- 39 son Corporate treasurer and 21 daughter at [place] [employer]- in [place] now.    Moved from [place] 2017.       [employer]- [employer] educational needs teacher      Hobbies: golf with Dr. Raliegh Ip, biking, walking trails, yoga    Allergies  Allergen Reactions  . Penicillins Rash    Current Outpatient Medications  Medication Sig Dispense Refill  . pravastatin (PRAVACHOL) 40 MG tablet Take 1 tablet (40 mg total) by mouth daily. 90 tablet 3  . Vitamin D, Ergocalciferol, 2000 units CAPS Take 1 capsule by mouth as needed.      No current facility-administered medications for this visit.     REVIEW OF SYSTEMS:  [X]  denotes  positive finding, [ ]  denotes negative finding Cardiac  Comments:  Chest pain or chest pressure:    Shortness of breath upon exertion:    Short of breath when lying flat:    Irregular heart rhythm:        Vascular    Pain in calf, thigh, or hip brought on by ambulation:    Pain in feet at night that wakes you up from your sleep:     Blood clot in your veins:    Leg swelling:         Pulmonary    Oxygen at home:    Productive cough:     Wheezing:         Neurologic    Sudden weakness in arms or legs:     Sudden numbness in arms or legs:     Sudden onset of difficulty speaking or slurred speech:    Temporary loss of vision in one eye:     Problems with dizziness:         Gastrointestinal    Blood in stool:     Vomited blood:         Genitourinary    Burning when urinating:     Blood in urine:        Psychiatric    Major depression:         Hematologic    Bleeding problems:    Problems with blood clotting too easily:        Skin    Rashes or ulcers:        Constitutional    Fever or chills:      PHYSICAL EXAM: Vitals:   10/14/18 1031  BP: (!) 130/93  Pulse: 75  Resp: 20  Temp: 97.6 F (36.4 C)  SpO2: 98%  Weight: 167 lb 11.2 oz (76.1 kg)  Height: 5' 6.5" (1.689 m)    GENERAL: The patient is a well-nourished male, in no acute distress. The vital signs are documented above. CARDIOVASCULAR: Carotid arteries without bruits bilaterally.  Heart regular rate and rhythm.  2+ radial and 2+ dorsalis pedis pulses bilaterally. PULMONARY: There is good air exchange  ABDOMEN: Soft and non-tender  MUSCULOSKELETAL: There are no major deformities or cyanosis. NEUROLOGIC: No focal weakness or paresthesias are detected. SKIN: There are no ulcers or rashes noted. PSYCHIATRIC: The patient has a normal  affect.  DATA:  I reviewed his MRI.  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  Plain films from July 2019 show no evidence of calcification of his vessels   MEDICAL ISSUES: Low discussion with the patient regarding my role in exposure.  Explained a left transverse incision over the L4-5 disc level.  Explained mobilization of the rectus muscle, intraperitoneal contents, left ureter and mobilization of the arterial and venous structures overlying the spine.  I discussed the potential for injury to all these including potential for major venous injury.  I do not see any contraindication to surgery.  He is not obese, he has never had any prior intra-abdominal surgery and has no evidence of extreme atherosclerosis of his aortoiliac segments.  We will plan to proceed as scheduled with Dr. Richardson Landry, MD [facility] [phone] Pager [phone]

## 2018-10-20 NOTE — Progress Notes (Signed)
[id number] - [place], [address] AT [address] [place] [address] Phone: [phone] Fax: [phone]      Your procedure is scheduled on October 28  Report to [facility] Main Entrance "A" at 0530 A.M., and check in at the Admitting office.  Call this number if you have problems the morning of surgery:  [phone]  Call [phone] if you have any questions prior to your surgery date Monday-Friday 8am-4pm    Remember:  Do not eat or drink after midnight the night before your surgery    There are NO medications that you need to take the morning of surgery   7 days prior to surgery STOP taking any Aspirin (unless otherwise instructed by your surgeon), Aleve, Naproxen, Ibuprofen, Motrin, Advil, Goody's, BC's, all herbal medications, fish oil, and all vitamins.    The Morning of Surgery  Do not wear jewelry  Do not wear lotions, powders, or colognes, or deodorant  Men may shave face and neck.  Do not bring valuables to the hospital.  [facility] is not responsible for any belongings or valuables.  If you are a smoker, DO NOT Smoke 24 hours prior to surgery IF you wear a CPAP at night please bring your mask, tubing, and machine the morning of surgery   Remember that you must have someone to transport you home after your surgery, and remain with you for 24 hours if you are discharged the same day.   Contacts, glasses, hearing aids, dentures or bridgework may not be worn into surgery.    Leave your suitcase in the car.  After surgery it may be brought to your room.  For patients admitted to the hospital, discharge time will be determined by your treatment team.  Patients discharged the day of surgery will not be allowed to drive home.    Special instructions:   [facility]- Preparing For Surgery  Before surgery, you can play an important role. Because skin is not sterile, your skin needs to be as  free of germs as possible. You can reduce the number of germs on your skin by washing with CHG (chlorahexidine gluconate) Soap before surgery.  CHG is an antiseptic cleaner which kills germs and bonds with the skin to continue killing germs even after washing.    Oral Hygiene is also important to reduce your risk of infection.  Remember - BRUSH YOUR TEETH THE MORNING OF SURGERY WITH YOUR REGULAR TOOTHPASTE  Please do not use if you have an allergy to CHG or antibacterial soaps. If your skin becomes reddened/irritated stop using the CHG.  Do not shave (including legs and underarms) for at least 48 hours prior to first CHG shower. It is OK to shave your face.  Please follow these instructions carefully.   1. Shower the NIGHT BEFORE SURGERY and the MORNING OF SURGERY with CHG Soap.   2. If you chose to wash your hair, wash your hair first as usual with your normal shampoo.  3. After you shampoo, rinse your hair and body thoroughly to remove the shampoo.  4. Use CHG as you would any other liquid soap. You can apply CHG directly to the skin and wash gently with a scrungie or a clean washcloth.   5. Apply the CHG Soap to your body ONLY FROM THE NECK DOWN.  Do not use on open wounds or open sores. Avoid contact with your eyes, ears, mouth and genitals (private parts).  Wash Face and genitals (private parts)  with your normal soap.   6. Wash thoroughly, paying special attention to the area where your surgery will be performed.  7. Thoroughly rinse your body with warm water from the neck down.  8. DO NOT shower/wash with your normal soap after using and rinsing off the CHG Soap.  9. Pat yourself dry with a CLEAN TOWEL.  10. Wear CLEAN PAJAMAS to bed the night before surgery, wear comfortable clothes the morning of surgery  11. Place CLEAN SHEETS on your bed the night of your first shower and DO NOT SLEEP WITH PETS.    Day of Surgery:  Do not apply any deodorants/lotions. Please shower the  morning of surgery with the CHG soap  Please wear clean clothes to the hospital/surgery center.   Remember to brush your teeth WITH YOUR REGULAR TOOTHPASTE.   Please read over the following fact sheets that you were given.

## 2018-10-21 ENCOUNTER — Other Ambulatory Visit: Payer: Self-pay

## 2018-10-21 ENCOUNTER — Encounter (HOSPITAL_COMMUNITY)
Admission: RE | Admit: 2018-10-21 | Discharge: 2018-10-21 | Disposition: A | Discharge status: Home / Self Care | Payer: BC Managed Care – PPO | Source: Ambulatory Visit | Attending: Neurological Surgery | Admitting: Neurological Surgery

## 2018-10-21 ENCOUNTER — Ambulatory Visit (HOSPITAL_COMMUNITY)
Admission: RE | Admit: 2018-10-21 | Discharge: 2018-10-21 | Disposition: A | Discharge status: Home / Self Care | Payer: BC Managed Care – PPO | Source: Ambulatory Visit | Attending: Neurological Surgery | Admitting: Neurological Surgery

## 2018-10-21 ENCOUNTER — Encounter (HOSPITAL_COMMUNITY): Payer: Self-pay

## 2018-10-21 DIAGNOSIS — Z01818 Encounter for other preprocedural examination: Secondary | ICD-10-CM | POA: Diagnosis not present

## 2018-10-21 DIAGNOSIS — M4310 Spondylolisthesis, site unspecified: Secondary | ICD-10-CM

## 2018-10-21 LAB — BASIC METABOLIC PANEL
Anion gap: 10 (ref 5–15)
BUN: 14 mg/dL (ref 6–20)
CO2: 27 mmol/L (ref 22–32)
Calcium: 9.2 mg/dL (ref 8.9–10.3)
Chloride: 102 mmol/L (ref 98–111)
Creatinine, Ser: 1 mg/dL (ref 0.61–1.24)
GFR calc Af Amer: >60 mL/min (ref >60)
GFR calc non Af Amer: >60 mL/min (ref >60)
Glucose, Bld: 110 mg/dL — ABNORMAL HIGH (ref 70–99)
Potassium: 4.3 mmol/L (ref 3.5–5.1)
Sodium: 139 mmol/L (ref 135–145)

## 2018-10-21 LAB — CBC WITH DIFFERENTIAL/PLATELET
Abs Immature Granulocytes: 0.02 10*3/uL (ref 0.00–0.07)
Basophils Absolute: 0 10*3/uL (ref 0.0–0.1)
Basophils Relative: 1 %
Eosinophils Absolute: 0.1 10*3/uL (ref 0.0–0.5)
Eosinophils Relative: 2 %
HCT: 47.8 % (ref 39.0–52.0)
Hemoglobin: 16.1 g/dL (ref 13.0–17.0)
Immature Granulocytes: 0 %
Lymphocytes Relative: 39 %
Lymphs Abs: 2.2 10*3/uL (ref 0.7–4.0)
MCH: 30.9 pg (ref 26.0–34.0)
MCHC: 33.7 g/dL (ref 30.0–36.0)
MCV: 91.7 fL (ref 80.0–100.0)
Monocytes Absolute: 0.5 10*3/uL (ref 0.1–1.0)
Monocytes Relative: 10 %
Neutro Abs: 2.7 10*3/uL (ref 1.7–7.7)
Neutrophils Relative %: 48 %
Platelets: 232 10*3/uL (ref 150–400)
RBC: 5.21 MIL/uL (ref 4.22–5.81)
RDW: 12.5 % (ref 11.5–15.5)
WBC: 5.5 10*3/uL (ref 4.0–10.5)
nRBC: 0 % (ref 0.0–0.2)

## 2018-10-21 LAB — SURGICAL PCR SCREEN
MRSA, PCR: NEGATIVE
Staphylococcus aureus: POSITIVE — AB

## 2018-10-21 LAB — TYPE AND SCREEN
ABO/RH(D): O NEG
Antibody Screen: NEGATIVE

## 2018-10-21 LAB — ABO/RH: ABO/RH(D): O NEG

## 2018-10-21 LAB — PROTIME-INR
INR: 0.9 (ref 0.8–1.2)
Prothrombin Time: 12.2 seconds (ref 11.4–15.2)

## 2018-10-21 NOTE — Progress Notes (Signed)
PCP - Dr. Garret Reddish Cardiologist - patient denies  PPM/ICD - n/a Device Orders -  Rep Notified -   Chest x-ray - 10/21/18 EKG - 10/21/18 Stress Test - patient denies ECHO - patient denies Cardiac Cath - patient denies  Sleep Study - patient denies CPAP -   Fasting Blood Sugar - n/a Checks Blood Sugar _____ times a day  Blood Thinner Instructions: n/a Aspirin Instructions:n/a  ERAS Protcol - n/a PRE-SURGERY Ensure or G2-   COVID TEST- scheduled for 10/25/2018   Anesthesia review: n/a  Patient denies shortness of breath, fever, cough and chest pain at PAT appointment   All instructions explained to the patient, with a verbal understanding of the material. Patient agrees to go over the instructions while at home for a better understanding. Patient also instructed to self quarantine after being tested for COVID-19. The opportunity to ask questions was provided.

## 2018-10-25 ENCOUNTER — Other Ambulatory Visit (HOSPITAL_COMMUNITY)
Admission: RE | Admit: 2018-10-25 | Discharge: 2018-10-25 | Disposition: A | Discharge status: Home / Self Care | Payer: BC Managed Care – PPO | Source: Ambulatory Visit | Attending: Neurological Surgery | Admitting: Neurological Surgery

## 2018-10-25 DIAGNOSIS — Z01812 Encounter for preprocedural laboratory examination: Secondary | ICD-10-CM | POA: Diagnosis not present

## 2018-10-25 DIAGNOSIS — Z20828 Contact with and (suspected) exposure to other viral communicable diseases: Secondary | ICD-10-CM | POA: Diagnosis not present

## 2018-10-26 LAB — NOVEL CORONAVIRUS, NAA (HOSP ORDER, SEND-OUT TO REF LAB; TAT 18-24 HRS): SARS-CoV-2, NAA: NOT DETECTED

## 2018-10-29 ENCOUNTER — Encounter (HOSPITAL_COMMUNITY): Payer: Self-pay

## 2018-10-29 ENCOUNTER — Inpatient Hospital Stay (HOSPITAL_COMMUNITY): Payer: BC Managed Care – PPO | Admitting: Anesthesiology

## 2018-10-29 ENCOUNTER — Encounter (HOSPITAL_COMMUNITY)
Admission: RE | Disposition: A | Discharge status: Home / Self Care | Payer: Self-pay | Source: Home / Self Care | Attending: Neurological Surgery

## 2018-10-29 ENCOUNTER — Other Ambulatory Visit: Payer: Self-pay

## 2018-10-29 ENCOUNTER — Inpatient Hospital Stay (HOSPITAL_COMMUNITY): Payer: BC Managed Care – PPO

## 2018-10-29 ENCOUNTER — Inpatient Hospital Stay (HOSPITAL_COMMUNITY)
Admission: RE | Admit: 2018-10-29 | Discharge: 2018-10-30 | DRG: 455 | Disposition: A | Discharge status: Home / Self Care | Payer: BC Managed Care – PPO | Attending: Neurological Surgery | Admitting: Neurological Surgery

## 2018-10-29 DIAGNOSIS — M5136 Other intervertebral disc degeneration, lumbar region: Secondary | ICD-10-CM | POA: Diagnosis not present

## 2018-10-29 DIAGNOSIS — M4316 Spondylolisthesis, lumbar region: Principal | ICD-10-CM | POA: Diagnosis present

## 2018-10-29 DIAGNOSIS — M4807 Spinal stenosis, lumbosacral region: Secondary | ICD-10-CM | POA: Diagnosis present

## 2018-10-29 DIAGNOSIS — M545 Low back pain: Secondary | ICD-10-CM | POA: Diagnosis not present

## 2018-10-29 DIAGNOSIS — Z419 Encounter for procedure for purposes other than remedying health state, unspecified: Secondary | ICD-10-CM

## 2018-10-29 DIAGNOSIS — Z981 Arthrodesis status: Secondary | ICD-10-CM

## 2018-10-29 DIAGNOSIS — M5127 Other intervertebral disc displacement, lumbosacral region: Secondary | ICD-10-CM | POA: Diagnosis present

## 2018-10-29 DIAGNOSIS — Z20828 Contact with and (suspected) exposure to other viral communicable diseases: Secondary | ICD-10-CM | POA: Diagnosis present

## 2018-10-29 DIAGNOSIS — K219 Gastro-esophageal reflux disease without esophagitis: Secondary | ICD-10-CM | POA: Diagnosis not present

## 2018-10-29 DIAGNOSIS — M5126 Other intervertebral disc displacement, lumbar region: Secondary | ICD-10-CM | POA: Diagnosis not present

## 2018-10-29 DIAGNOSIS — E785 Hyperlipidemia, unspecified: Secondary | ICD-10-CM | POA: Diagnosis present

## 2018-10-29 DIAGNOSIS — M48061 Spinal stenosis, lumbar region without neurogenic claudication: Secondary | ICD-10-CM | POA: Diagnosis present

## 2018-10-29 DIAGNOSIS — Z87891 Personal history of nicotine dependence: Secondary | ICD-10-CM | POA: Diagnosis not present

## 2018-10-29 HISTORY — PX: ANTERIOR LUMBAR FUSION: SHX1170

## 2018-10-29 HISTORY — PX: LAMINECTOMY WITH POSTERIOR LATERAL ARTHRODESIS LEVEL 1: SHX6335

## 2018-10-29 HISTORY — PX: ABDOMINAL EXPOSURE: SHX5708

## 2018-10-29 SURGERY — ANTERIOR LUMBAR FUSION 1 LEVEL
Anesthesia: General | Site: Back

## 2018-10-29 MED ORDER — LACTATED RINGERS IV SOLN
INTRAVENOUS | Status: DC
  Administered 2018-10-29 (×2): via INTRAVENOUS

## 2018-10-29 MED ORDER — PROPOFOL 10 MG/ML IV BOLUS
INTRAVENOUS | Status: DC | PRN
  Administered 2018-10-29: 170 mg via INTRAVENOUS

## 2018-10-29 MED ORDER — MIDAZOLAM HCL 5 MG/5ML IJ SOLN
INTRAMUSCULAR | Status: DC | PRN
  Administered 2018-10-29: 2 mg via INTRAVENOUS

## 2018-10-29 MED ORDER — POTASSIUM CHLORIDE IN NACL 20-0.9 MEQ/L-% IV SOLN: INTRAVENOUS | Status: DC

## 2018-10-29 MED ORDER — CHLORHEXIDINE GLUCONATE CLOTH 2 % EX PADS: 6.0000 | MEDICATED_PAD | Freq: Once | CUTANEOUS | Status: DC

## 2018-10-29 MED ORDER — SODIUM CHLORIDE 0.9 % IV SOLN: 250.0000 mL | INTRAVENOUS | Status: DC

## 2018-10-29 MED ORDER — PHENOL 1.4 % MT LIQD: 1.0000 | OROMUCOSAL | Status: DC | PRN

## 2018-10-29 MED ORDER — VANCOMYCIN HCL IN DEXTROSE 1-5 GM/200ML-% IV SOLN
1000.0000 mg | Freq: Once | INTRAVENOUS | Status: AC
  Administered 2018-10-29: 1000 mg via INTRAVENOUS
  Filled 2018-10-29: qty 200

## 2018-10-29 MED ORDER — SODIUM CHLORIDE 0.9 % IV SOLN
INTRAVENOUS | Status: DC | PRN
  Administered 2018-10-29: 500 mL

## 2018-10-29 MED ORDER — HYDROMORPHONE HCL 1 MG/ML IJ SOLN
INTRAMUSCULAR | Status: AC
  Filled 2018-10-29: qty 1

## 2018-10-29 MED ORDER — CELECOXIB 200 MG PO CAPS
200.0000 mg | ORAL_CAPSULE | Freq: Two times a day (BID) | ORAL | Status: DC
  Administered 2018-10-29: 200 mg via ORAL
  Filled 2018-10-29 (×3): qty 1

## 2018-10-29 MED ORDER — BUPIVACAINE HCL (PF) 0.25 % IJ SOLN
INTRAMUSCULAR | Status: AC
  Filled 2018-10-29: qty 30

## 2018-10-29 MED ORDER — ONDANSETRON HCL 4 MG/2ML IJ SOLN: 4.0000 mg | Freq: Four times a day (QID) | INTRAMUSCULAR | Status: DC | PRN

## 2018-10-29 MED ORDER — DEXAMETHASONE 4 MG PO TABS
4.0000 mg | ORAL_TABLET | Freq: Four times a day (QID) | ORAL | Status: DC
  Administered 2018-10-29: 4 mg via ORAL
  Filled 2018-10-29: qty 1

## 2018-10-29 MED ORDER — THROMBIN 20000 UNITS EX SOLR
CUTANEOUS | Status: DC | PRN
  Administered 2018-10-29: 20 mL via TOPICAL

## 2018-10-29 MED ORDER — ONDANSETRON HCL 4 MG PO TABS: 4.0000 mg | ORAL_TABLET | Freq: Four times a day (QID) | ORAL | Status: DC | PRN

## 2018-10-29 MED ORDER — HYDROMORPHONE HCL 1 MG/ML IJ SOLN
0.2500 mg | INTRAMUSCULAR | Status: DC | PRN
  Administered 2018-10-29 (×4): 0.5 mg via INTRAVENOUS

## 2018-10-29 MED ORDER — ACETAMINOPHEN 325 MG PO TABS
650.0000 mg | ORAL_TABLET | ORAL | Status: DC | PRN
  Filled 2018-10-29 (×2): qty 2

## 2018-10-29 MED ORDER — LIDOCAINE HCL (CARDIAC) PF 100 MG/5ML IV SOSY
PREFILLED_SYRINGE | INTRAVENOUS | Status: DC | PRN
  Administered 2018-10-29: 30 mg via INTRAVENOUS

## 2018-10-29 MED ORDER — SODIUM CHLORIDE 0.9% FLUSH
3.0000 mL | Freq: Two times a day (BID) | INTRAVENOUS | Status: DC
  Administered 2018-10-29: 3 mL via INTRAVENOUS

## 2018-10-29 MED ORDER — DEXAMETHASONE SODIUM PHOSPHATE 4 MG/ML IJ SOLN
4.0000 mg | Freq: Four times a day (QID) | INTRAMUSCULAR | Status: DC
  Administered 2018-10-29 – 2018-10-30 (×2): 4 mg via INTRAVENOUS
  Filled 2018-10-29 (×2): qty 1

## 2018-10-29 MED ORDER — THROMBIN 20000 UNITS EX SOLR
CUTANEOUS | Status: AC
  Filled 2018-10-29: qty 20000

## 2018-10-29 MED ORDER — SUGAMMADEX SODIUM 200 MG/2ML IV SOLN
INTRAVENOUS | Status: DC | PRN
  Administered 2018-10-29: 200 mg via INTRAVENOUS

## 2018-10-29 MED ORDER — METHOCARBAMOL 1000 MG/10ML IJ SOLN
500.0000 mg | Freq: Four times a day (QID) | INTRAVENOUS | Status: DC | PRN
  Filled 2018-10-29: qty 5

## 2018-10-29 MED ORDER — DEXAMETHASONE SODIUM PHOSPHATE 10 MG/ML IJ SOLN
10.0000 mg | Freq: Once | INTRAMUSCULAR | Status: AC
  Administered 2018-10-29: 08:00:00 10 mg via INTRAVENOUS
  Filled 2018-10-29: qty 1

## 2018-10-29 MED ORDER — PHENYLEPHRINE HCL-NACL 10-0.9 MG/250ML-% IV SOLN
INTRAVENOUS | Status: DC | PRN
  Administered 2018-10-29: 20 ug/min via INTRAVENOUS

## 2018-10-29 MED ORDER — VANCOMYCIN HCL IN DEXTROSE 1-5 GM/200ML-% IV SOLN
1000.0000 mg | INTRAVENOUS | Status: AC
  Administered 2018-10-29: 1000 mg via INTRAVENOUS
  Filled 2018-10-29: qty 200

## 2018-10-29 MED ORDER — SODIUM CHLORIDE 0.9% FLUSH: 3.0000 mL | INTRAVENOUS | Status: DC | PRN

## 2018-10-29 MED ORDER — OXYCODONE HCL 5 MG PO TABS
10.0000 mg | ORAL_TABLET | ORAL | Status: DC | PRN
  Administered 2018-10-29 – 2018-10-30 (×3): 10 mg via ORAL
  Filled 2018-10-29 (×3): qty 2

## 2018-10-29 MED ORDER — ACETAMINOPHEN 650 MG RE SUPP: 650.0000 mg | RECTAL | Status: DC | PRN

## 2018-10-29 MED ORDER — MENTHOL 3 MG MT LOZG: 1.0000 | LOZENGE | OROMUCOSAL | Status: DC | PRN

## 2018-10-29 MED ORDER — THROMBIN 5000 UNITS EX SOLR
OROMUCOSAL | Status: DC | PRN
  Administered 2018-10-29: 07:00:00 5 mL via TOPICAL

## 2018-10-29 MED ORDER — PROPOFOL 10 MG/ML IV BOLUS
INTRAVENOUS | Status: AC
  Filled 2018-10-29: qty 40

## 2018-10-29 MED ORDER — MORPHINE SULFATE (PF) 2 MG/ML IV SOLN: 2.0000 mg | INTRAVENOUS | Status: DC | PRN

## 2018-10-29 MED ORDER — SENNA 8.6 MG PO TABS
1.0000 | ORAL_TABLET | Freq: Two times a day (BID) | ORAL | Status: DC
  Administered 2018-10-29: 8.6 mg via ORAL
  Filled 2018-10-29: qty 1

## 2018-10-29 MED ORDER — FENTANYL CITRATE (PF) 250 MCG/5ML IJ SOLN
INTRAMUSCULAR | Status: AC
  Filled 2018-10-29: qty 5

## 2018-10-29 MED ORDER — THROMBIN 5000 UNITS EX SOLR
CUTANEOUS | Status: AC
  Filled 2018-10-29: qty 5000

## 2018-10-29 MED ORDER — CHLORHEXIDINE GLUCONATE 4 % EX LIQD: 60.0000 mL | Freq: Once | CUTANEOUS | Status: DC

## 2018-10-29 MED ORDER — POLYETHYLENE GLYCOL 3350 17 G PO PACK: 17.0000 g | PACK | Freq: Every day | ORAL | Status: DC | PRN

## 2018-10-29 MED ORDER — METHOCARBAMOL 500 MG PO TABS
500.0000 mg | ORAL_TABLET | Freq: Four times a day (QID) | ORAL | Status: DC | PRN
  Administered 2018-10-29 – 2018-10-30 (×2): 500 mg via ORAL
  Filled 2018-10-29 (×2): qty 1

## 2018-10-29 MED ORDER — ONDANSETRON HCL 4 MG/2ML IJ SOLN
INTRAMUSCULAR | Status: DC | PRN
  Administered 2018-10-29: 4 mg via INTRAVENOUS

## 2018-10-29 MED ORDER — MIDAZOLAM HCL 2 MG/2ML IJ SOLN
INTRAMUSCULAR | Status: AC
  Filled 2018-10-29: qty 2

## 2018-10-29 MED ORDER — ROCURONIUM BROMIDE 100 MG/10ML IV SOLN
INTRAVENOUS | Status: DC | PRN
  Administered 2018-10-29: 20 mg via INTRAVENOUS
  Administered 2018-10-29: 40 mg via INTRAVENOUS
  Administered 2018-10-29: 20 mg via INTRAVENOUS
  Administered 2018-10-29: 60 mg via INTRAVENOUS

## 2018-10-29 MED ORDER — FENTANYL CITRATE (PF) 100 MCG/2ML IJ SOLN
INTRAMUSCULAR | Status: DC | PRN
  Administered 2018-10-29: 100 ug via INTRAVENOUS
  Administered 2018-10-29 (×5): 50 ug via INTRAVENOUS
  Administered 2018-10-29: 150 ug via INTRAVENOUS

## 2018-10-29 MED ORDER — 0.9 % SODIUM CHLORIDE (POUR BTL) OPTIME
TOPICAL | Status: DC | PRN
  Administered 2018-10-29 (×2): 1000 mL

## 2018-10-29 SURGICAL SUPPLY — 97 items
APPLIER CLIP 11 MED OPEN (CLIP) ×3
BAG DECANTER FOR FLEXI CONT (MISCELLANEOUS) ×3 IMPLANT
BASKET BONE COLLECTION (BASKET) ×3 IMPLANT
BENZOIN TINCTURE PRP APPL 2/3 (GAUZE/BANDAGES/DRESSINGS) ×3 IMPLANT
BLADE CLIPPER SURG (BLADE) ×3 IMPLANT
BONE VIVIGEN FORMABLE 5.4CC (Bone Implant) ×3 IMPLANT
BUR BARRELL 4.0 (BURR) ×3 IMPLANT
BUR MATCHSTICK NEURO 3.0 LAGG (BURR) ×6 IMPLANT
CANISTER SUCT 3000ML PPV (MISCELLANEOUS) ×3 IMPLANT
CARTRIDGE OIL MAESTRO DRILL (MISCELLANEOUS) ×2 IMPLANT
CLIP APPLIE 11 MED OPEN (CLIP) ×2 IMPLANT
CLIP LIGATING EXTRA MED SLVR (CLIP) IMPLANT
CLIP LIGATING EXTRA SM BLUE (MISCELLANEOUS) IMPLANT
CONT SPEC 4OZ CLIKSEAL STRL BL (MISCELLANEOUS) IMPLANT
COVER BACK TABLE 24X17X13 BIG (DRAPES) IMPLANT
COVER BACK TABLE 60X90IN (DRAPES) ×3 IMPLANT
COVER WAND RF STERILE (DRAPES) ×6 IMPLANT
DERMABOND ADVANCED (GAUZE/BANDAGES/DRESSINGS) ×2
DERMABOND ADVANCED .7 DNX12 (GAUZE/BANDAGES/DRESSINGS) ×4 IMPLANT
DIFFUSER DRILL AIR PNEUMATIC (MISCELLANEOUS) ×6 IMPLANT
DRAPE C-ARM 42X72 X-RAY (DRAPES) ×6 IMPLANT
DRAPE C-ARMOR (DRAPES) ×6 IMPLANT
DRAPE LAPAROTOMY 100X72X124 (DRAPES) ×6 IMPLANT
DRAPE POUCH INSTRU U-SHP 10X18 (DRAPES) IMPLANT
DRAPE SURG 17X23 STRL (DRAPES) ×3 IMPLANT
DRSG OPSITE POSTOP 4X6 (GAUZE/BANDAGES/DRESSINGS) ×3 IMPLANT
DURAPREP 26ML APPLICATOR (WOUND CARE) ×6 IMPLANT
ELECT BLADE 4.0 EZ CLEAN MEGAD (MISCELLANEOUS) ×3
ELECT REM PT RETURN 9FT ADLT (ELECTROSURGICAL) ×6
ELECTRODE BLDE 4.0 EZ CLN MEGD (MISCELLANEOUS) ×2 IMPLANT
ELECTRODE REM PT RTRN 9FT ADLT (ELECTROSURGICAL) ×4 IMPLANT
EVACUATOR 1/8 PVC DRAIN (DRAIN) IMPLANT
GAUZE 4X4 16PLY RFD (DISPOSABLE) IMPLANT
GLOVE BIO SURGEON STRL SZ7 (GLOVE) IMPLANT
GLOVE BIO SURGEON STRL SZ8 (GLOVE) ×9 IMPLANT
GLOVE BIOGEL PI IND STRL 7.0 (GLOVE) ×2 IMPLANT
GLOVE BIOGEL PI INDICATOR 7.0 (GLOVE) ×1
GLOVE SS BIOGEL STRL SZ 7.5 (GLOVE) ×2 IMPLANT
GLOVE SUPERSENSE BIOGEL SZ 7.5 (GLOVE) ×1
GLOVE SURG SS PI 7.5 STRL IVOR (GLOVE) ×9 IMPLANT
GLOVE SURG SS PI 8.0 STRL IVOR (GLOVE) ×3 IMPLANT
GOWN STRL REUS W/ TWL LRG LVL3 (GOWN DISPOSABLE) ×8 IMPLANT
GOWN STRL REUS W/ TWL XL LVL3 (GOWN DISPOSABLE) ×8 IMPLANT
GOWN STRL REUS W/TWL 2XL LVL3 (GOWN DISPOSABLE) IMPLANT
GOWN STRL REUS W/TWL LRG LVL3 (GOWN DISPOSABLE) ×4
GOWN STRL REUS W/TWL XL LVL3 (GOWN DISPOSABLE) ×4
HEMOSTAT POWDER KIT SURGIFOAM (HEMOSTASIS) ×3 IMPLANT
INSERT FOGARTY 61MM (MISCELLANEOUS) IMPLANT
INSERT FOGARTY SM (MISCELLANEOUS) IMPLANT
KIT BASIN OR (CUSTOM PROCEDURE TRAY) ×6 IMPLANT
KIT TURNOVER KIT B (KITS) ×6 IMPLANT
LOOP VESSEL MAXI BLUE (MISCELLANEOUS) IMPLANT
LOOP VESSEL MINI RED (MISCELLANEOUS) IMPLANT
MILL MEDIUM DISP (BLADE) ×3 IMPLANT
NEEDLE HYPO 25X1 1.5 SAFETY (NEEDLE) ×6 IMPLANT
NS IRRIG 1000ML POUR BTL (IV SOLUTION) ×6 IMPLANT
OIL CARTRIDGE MAESTRO DRILL (MISCELLANEOUS) ×3
PACK LAMINECTOMY NEURO (CUSTOM PROCEDURE TRAY) ×6 IMPLANT
PAD ARMBOARD 7.5X6 YLW CONV (MISCELLANEOUS) IMPLANT
PLATE ASPIDA LUMBAR 16 (Plate) ×3 IMPLANT
ROD LORD LIP TI 5.5X30 (Rod) ×6 IMPLANT
SCREW 2.0X8MM (Screw) ×3 IMPLANT
SCREW 30MM (Screw) ×12 IMPLANT
SCREW CANC SHANK MOD 5.5X45 (Screw) ×6 IMPLANT
SCREW POLYAXIAL TULIP (Screw) ×12 IMPLANT
SCREW SHANK MOD 5.5X40 (Screw) ×6 IMPLANT
SET SCREW (Screw) ×4 IMPLANT
SET SCREW SPNE (Screw) ×8 IMPLANT
SPACER IDENTITI 11X38X28 10D (Spacer) ×3 IMPLANT
SPONGE INTESTINAL PEANUT (DISPOSABLE) ×6 IMPLANT
SPONGE LAP 18X18 RF (DISPOSABLE) ×3 IMPLANT
SPONGE LAP 4X18 RFD (DISPOSABLE) IMPLANT
SPONGE NEURO XRAY DETECT 1X3 (DISPOSABLE) ×3 IMPLANT
SPONGE SURGIFOAM ABS GEL 100 (HEMOSTASIS) ×3 IMPLANT
STAPLER VISISTAT 35W (STAPLE) IMPLANT
STRIP CLOSURE SKIN 1/2X4 (GAUZE/BANDAGES/DRESSINGS) ×3 IMPLANT
SUT PDS AB 0 CTX 60 (SUTURE) IMPLANT
SUT PROLENE 4 0 RB 1 (SUTURE)
SUT PROLENE 4-0 RB1 .5 CRCL 36 (SUTURE) IMPLANT
SUT PROLENE 5 0 CC1 (SUTURE) IMPLANT
SUT PROLENE 6 0 C 1 30 (SUTURE) IMPLANT
SUT PROLENE 6 0 CC (SUTURE) IMPLANT
SUT SILK 0 TIES 10X30 (SUTURE) ×3 IMPLANT
SUT SILK 2 0 TIES 10X30 (SUTURE) ×3 IMPLANT
SUT SILK 2 0SH CR/8 30 (SUTURE) IMPLANT
SUT SILK 3 0 TIES 10X30 (SUTURE) ×3 IMPLANT
SUT SILK 3 0SH CR/8 30 (SUTURE) IMPLANT
SUT VIC AB 0 CT1 18XCR BRD8 (SUTURE) ×2 IMPLANT
SUT VIC AB 0 CT1 8-18 (SUTURE) ×1
SUT VIC AB 0 CTX 18 (SUTURE) ×3 IMPLANT
SUT VIC AB 2-0 CP2 18 (SUTURE) ×3 IMPLANT
SUT VIC AB 3-0 SH 8-18 (SUTURE) ×6 IMPLANT
SWABSTK COMLB BENZOIN TINCTURE (MISCELLANEOUS) ×3 IMPLANT
TOWEL GREEN STERILE (TOWEL DISPOSABLE) ×6 IMPLANT
TOWEL GREEN STERILE FF (TOWEL DISPOSABLE) ×6 IMPLANT
TRAY FOLEY MTR SLVR 16FR STAT (SET/KITS/TRAYS/PACK) ×3 IMPLANT
WATER STERILE IRR 1000ML POUR (IV SOLUTION) ×6 IMPLANT

## 2018-10-29 NOTE — Progress Notes (Signed)
Physical Therapy Evaluation Patient Details Name: Curtis Butler MRN: [id number] DOB: [birth year] Today's Date: 10/29/2018   History of Present Illness  pt is a 58 y/o male admitted with back and leg pain.  Pt s/p  decompressive lami and hemifacetectomy at L$%, ALIF with allograft, post fixation, ant plating and Left L4 S1 decompressive hemilami, faceectomyt, foraminotomy with discectomy.  Clinical Impression  Pt admitted with/for lumbar decompression/fusion surgery.  Pt is generally at a supervision level for mobility.  Pt currently limited functionally due to the problems listed below.  (see problems list.)  Pt will benefit from PT to maximize function and safety to be able to get home safely with available assist.     Follow Up Recommendations No PT follow up    Equipment Recommendations  None recommended by PT    Recommendations for Other Services       Precautions / Restrictions Precautions Precautions: Back Precaution Booklet Issued: No Required Braces or Orthoses: Spinal Brace Spinal Brace: Lumbar corset;Applied in sitting position Restrictions Weight Bearing Restrictions: No      Mobility  Bed Mobility Overal bed mobility: Needs Assistance Bed Mobility: Rolling;Sidelying to Sit;Sit to Sidelying Rolling: Supervision Sidelying to sit: Supervision     Sit to sidelying: Supervision    Transfers Overall transfer level: Needs assistance   Transfers: Sit to/from Stand Sit to Stand: Min guard            Ambulation/Gait Ambulation/Gait assistance: Supervision Gait Distance (Feet): 300 Feet Assistive device: None Gait Pattern/deviations: Step-through pattern     General Gait Details: steady and moderate cadence  Stairs Stairs: Yes   Stair Management: One rail Right;Alternating pattern;Forwards Number of Stairs: 3 General stair comments: safe with rail  Wheelchair Mobility    Modified Rankin (Stroke Patients Only)       Balance Overall  balance assessment: No apparent balance deficits (not formally assessed)                                           Pertinent Vitals/Pain Pain Assessment: Faces Faces Pain Scale: Hurts little more Pain Location: ant/post incisions Pain Descriptors / Indicators: Aching;Grimacing;Guarding Pain Intervention(s): Monitored during session    Home Living Family/patient expects to be discharged to:: Private residence Living Arrangements: Spouse/significant other Available Help at Discharge: Family;Available 24 hours/day Type of Home: House Home Access: Stairs to enter Entrance Stairs-Rails: Psychiatric nurse of Steps: 2 Home Layout: Two level;Able to live on main level with bedroom/bathroom Home Equipment: None      Prior Function Level of Independence: Independent               Hand Dominance        Extremity/Trunk Assessment   Upper Extremity Assessment Upper Extremity Assessment: Defer to OT evaluation    Lower Extremity Assessment Lower Extremity Assessment: Defer to PT evaluation       Communication   Communication: No difficulties  Cognition Arousal/Alertness: Awake/alert Behavior During Therapy: WFL for tasks assessed/performed Overall Cognitive Status: Within Functional Limits for tasks assessed                                        General Comments General comments (skin integrity, edema, etc.): pt and wife instructed in back care/prec, log roll, lifting restrictions, bracing issues and  progression of activity.    Exercises     Assessment/Plan    PT Assessment Patient needs continued PT services  PT Problem List Decreased activity tolerance;Decreased mobility;Decreased knowledge of precautions;Pain       PT Treatment Interventions Gait training;Stair training;Functional mobility training;Therapeutic activities;Patient/family education    PT Goals (Current goals can be found in the Care Plan  section)  Acute Rehab PT Goals Patient Stated Goal: home independent PT Goal Formulation: With patient Time For Goal Achievement: 10/31/18 Potential to Achieve Goals: Good    Frequency Min 5X/week   Barriers to discharge        Co-evaluation               AM-PAC PT "6 Clicks" Mobility  Outcome Measure Help needed turning from your back to your side while in a flat bed without using bedrails?: None Help needed moving from lying on your back to sitting on the side of a flat bed without using bedrails?: None Help needed moving to and from a bed to a chair (including a wheelchair)?: None Help needed standing up from a chair using your arms (e.g., wheelchair or bedside chair)?: A Little Help needed to walk in hospital room?: A Little Help needed climbing 3-5 steps with a railing? : A Little 6 Click Score: 21    End of Session Equipment Utilized During Treatment: Back brace Activity Tolerance: Patient tolerated treatment well Patient left: in bed;Other (comment);with call bell/phone within reach;with family/visitor present Nurse Communication: Mobility status PT Visit Diagnosis: Other abnormalities of gait and mobility (R26.89);Pain Pain - part of body: (incisions back and front)    Time: 1710-1727 PT Time Calculation (min) (ACUTE ONLY): 17 min   Charges:   PT Evaluation $PT Eval Low Complexity: 1 Low          10/29/2018  Donnella Sham, PT Acute Rehabilitation Services [phone]  (pager) [phone]  (office)  Tessie Fass Geneveive Furness 10/29/2018, 5:53 PM

## 2018-10-29 NOTE — Op Note (Signed)
    OPERATIVE REPORT  DATE OF SURGERY: 10/29/2018  PATIENT: Curtis Butler, 58 y.o. male MRN: [id number]  DOB: 17-Mar-1960  PRE-OPERATIVE DIAGNOSIS: Degenerative disc disease  POST-OPERATIVE DIAGNOSIS:  Same  PROCEDURE: Anterior exposure for L4-5 disc fusion  SURGEON:  Curt Jews, M.D.  Co-surgeon Dr. Sherley Bounds  ANESTHESIA: General  EBL: per anesthesia record  Total I/O In: [id number] [I.V.:1750] Out: [id number] [Urine:610; Blood:325]  BLOOD ADMINISTERED: none  DRAINS: none  SPECIMEN: none  COUNTS CORRECT:  YES  PATIENT DISPOSITION:  PACU - hemodynamically stable  PROCEDURE DETAILS: Patient was taken to room placed supine position where the area of the abdomen was prepped draped you sterile fashion.  Crosstable lateral C arm was used to identify the level of the 4 5 disc in relationship to the skin.  This was just below the level of the umbilicus.  An incision was made from the midline to the left and carried down through the subcutaneous fat with electrocautery.  The anterior rectus sheath was opened with electrocautery.  The rectus muscle was mobilized circumferentially.  The posterior rectus sheath was opened laterally.  Blunt dissection in the retroperitoneal space was carried down to the level of the psoas.  The ureter was mobilized to the right.  The dissection was continued down to the 4 5 disc space.  The patient had a single large iliolumbar vein and this was ligated with 2-0 silk ties and divided.  Blunt dissection continued to allow adequate visualization to the right and left of the L4-5 disc.  The Thompson retractor was brought onto the field and the reverse lip 150 blade was positioned to the right and left of the L4-5 disc.  Malleable retractors were used for superior and inferior exposure.  A 18-gauge needle was placed in the 4 5 disc space and C-arm was brought back into the field to confirm that this was the appropriate level.  The remainder the procedure will be  dictated as a separate note by Dr. Richardson Landry, M.D., [facility] 10/29/2018 1:34 PM

## 2018-10-29 NOTE — Anesthesia Preprocedure Evaluation (Addendum)
Anesthesia Evaluation  Patient identified by MRN, date of birth, ID band Patient awake    Airway Mallampati: II  TM Distance: >3 FB     Dental   Pulmonary former smoker,    breath sounds clear to auscultation       Cardiovascular  Rhythm:Regular Rate:Normal     Neuro/Psych    GI/Hepatic GERD  ,  Endo/Other    Renal/GU      Musculoskeletal   Abdominal   Peds  Hematology   Anesthesia Other Findings   Reproductive/Obstetrics                            Anesthesia Physical Anesthesia Plan  ASA: III  Anesthesia Plan: General   Post-op Pain Management:    Induction:   PONV Risk Score and Plan: Ondansetron, Dexamethasone and Midazolam  Airway Management Planned: Oral ETT  Additional Equipment:   Intra-op Plan:   Post-operative Plan: Extubation in OR  Informed Consent: I have reviewed the patients History and Physical, chart, labs and discussed the procedure including the risks, benefits and alternatives for the proposed anesthesia with the patient or authorized representative who has indicated his/her understanding and acceptance.     Dental advisory given  Plan Discussed with: CRNA and Anesthesiologist  Anesthesia Plan Comments:        Anesthesia Quick Evaluation

## 2018-10-29 NOTE — Transfer of Care (Signed)
Immediate Anesthesia Transfer of Care Note  Patient: Curtis Butler  Procedure(s) Performed: Anterior Lumbar Interbody Fusion  - Lumbar four-Lumbar five, posterior instrumented fusion Lumbar four-five (N/A ) Left Lumbar five-Sacral one hemilaminectomy; Posterior Instrumented Fusion Lumbar four-five (Left Back) ABDOMINAL EXPOSURE (N/A )  Patient Location: PACU  Anesthesia Type:General  Level of Consciousness: awake, alert  and oriented  Airway & Oxygen Therapy: Patient Spontanous Breathing  Post-op Assessment: Report given to RN and Post -op Vital signs reviewed and stable  Post vital signs: Reviewed and stable  Last Vitals:  Vitals Value Taken Time  BP 123/63 10/29/18 1219  Temp    Pulse 85 10/29/18 1224  Resp 17 10/29/18 1224  SpO2 100 % 10/29/18 1224  Vitals shown include unvalidated device data.  Last Pain:  Vitals:   10/29/18 1219  TempSrc:   PainSc: (P) 8          Complications: No apparent anesthesia complications

## 2018-10-29 NOTE — Anesthesia Procedure Notes (Signed)
Procedure Name: Intubation Date/Time: 10/29/2018 7:49 AM Performed by: Eligha Bridegroom, CRNA Pre-anesthesia Checklist: Patient identified, Emergency Drugs available, Suction available, Patient being monitored and Timeout performed Patient Re-evaluated:Patient Re-evaluated prior to induction Oxygen Delivery Method: Circle system utilized Preoxygenation: Pre-oxygenation with 100% oxygen Induction Type: IV induction Laryngoscope Size: Mac and 4 Grade View: Grade I Tube type: Oral Tube size: 7.5 mm Number of attempts: 1 Airway Equipment and Method: Stylet Placement Confirmation: ETT inserted through vocal cords under direct vision,  positive ETCO2 and breath sounds checked- equal and bilateral Secured at: 22 cm Tube secured with: Tape Dental Injury: Teeth and Oropharynx as per pre-operative assessment

## 2018-10-29 NOTE — Op Note (Signed)
10/29/2018  12:10 PM  PATIENT:  Curtis Butler  58 y.o. 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 Decompressive lumbar laminectomy L4-5, and hemifacetectomy (Gill type ) 2.  Anterior lumbar interbody fusion L4-5 using porous titanium interbody cage packed with morcellized allograft 3. Posterior fixation L4-5 using Alphatec cortical pedicle screws.  4.  Anterior lumbar plating L4-5 utilizing Alphatec plate 5.  Left L5-S1 decompressive hemilaminectomy medial facetectomy foraminotomy with discectomy 6. Intertransverse arthrodesis L4-5 using morcellized autograft  SURGEON:  Sherley Bounds, MD Co-surgeon: Dr. Donnetta Hutching  ASSISTANTS: Glenford Peers, FNP  ANESTHESIA:  General  EBL: 325 ml  Total I/O In: 1750 [I.V.:1750] Out: [id number] [Urine:610; Blood:325]  BLOOD ADMINISTERED:none  DRAINS: none   INDICATION FOR PROCEDURE: This patient presented with back and left leg pain. Imaging revealed lytic dynamic spondylolisthesis L4-5 with spinal stenosis L5-S1 and L4-5. The patient tried a reasonable attempt at conservative medical measures without relief. I recommended decompression and instrumented fusion to address the stenosis as well as the segmental  instability.  Patient understood the risks, benefits, and alternatives and potential outcomes and wished to proceed.  PROCEDURE DETAILS:  The patient was brought to the operating room. After induction of generalized endotracheal anesthesia the patient was placed in the supine position on the operating table.  The exposure was performed by Dr. Sherren Mocha early of vascular surgery and this will be described in a separate note.  Once the exposure was performed we marked the midline with a spinal needle and AP fluoroscopy.  We confirmed our level at L4-5 with lateral fluoroscopy.  I then incised the disc  space and perform the initial discectomy with pituitary rongeurs once I released the disc from the endplates with a Cobb elevator.  I then used a high-speed drill to drill the endplates to prepare them for arthrodesis.  I decompress the epidural space with a 3 and 4 mm Kerrison punch.  This was all done utilizing lateral fluoroscopy when needed.  We then used sequential trials starting with a 12 mm trial and ending with a 16 mm trial that fit the best.  We then placed the corresponding porous titanium cage packed with morselized allograft into the interspace utilizing lateral fluoroscopy at L4-5.  I then used an Alphatec plate and held in place on the anterior lumbar spine over L4-5 with a stay pin.  I then used the awl to awl to a depth of 22 mm.  I then placed 30 mm screws into the L4 vertebral body and 35 mm screws into L5.  These were locked into the plate by the locking mechanism of the plate.  We then irrigated with saline solution.  We checked our final construct with AP and lateral fluoroscopy.  I remove the retractor system looking for any bleeding.  I then dissected down after the retractor system was out and made sure there was no bleeding.  I saw none.  Therefore I closed the rectus fascia with a running 2-0 Prolene.  A AP abdominal film was taken and read by the radiologist as no retained foreign body.  Closed the subcutaneous tissues with 2-0 Vicryl in the subcuticular tissue with 3-0 Vicryl.  I then closed the skin with Dermabond.  The drapes were removed.   The patient was then repositioned and rolled into the prone position on chest rolls and all pressure points were  padded. The patient's lumbar region was cleaned and then prepped with DuraPrep and draped in the usual sterile fashion. Anesthesia was injected and then a dorsal midline incision was made and carried down to the lumbosacral fascia. The fascia was opened and the paraspinous musculature was taken down in a subperiosteal fashion to  expose L4-5 and L5-S1. A self-retaining retractor was placed.  Intraoperative fluoroscopy confirmed my level at L4-5 and L5-S1.  I then turned my attention to the decompression and complete lumbar laminectomies, hemi- facetectomies, and foraminotomies were performed at L4-5 and the left hemilaminectomy medial facetectomy foraminotomies L5-S1. The patient had significant spinal stenosis and this required more work than would be required for a simple exposure of the disc for posterior lumbar interbody fusion which would only require a limited laminotomy. Much more generous decompression and generous foraminotomy was undertaken in order to adequately decompress the neural elements and address the patient's leg pain.  A full Gill type decompression was performed.  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  the yellow ligament was removed to expose the underlying dura and nerve roots, and generous foraminotomies were performed to adequately decompress the neural elements. Both the exiting and traversing nerve roots were decompressed on both sides until a coronary dilator passed easily along the nerve roots.  At L5-S1 on the left there was a large midline subannular disc protrusion, and so a performed a small annulotomy and removed a fair amount of subannular disc herniation but did not do an intradiscal discectomy.  This seemed to reduce compression in the midline.  Once the decompression was complete, we then turned our attention to the placement of the pedicle screws. The pedicle screw entry zones were identified utilizing surface landmarks and AP and lateral fluoroscopy. I drilled into each pedicle utilizing the awl/tap combo. We palpated with a ball probe to assure no break in the cortex. We then placed 5.5 x 40 mm cortical pedicle screws into the pedicles bilaterally at L4 and 5.5 x 45 mm pedicle screws at L5. We then decorticated the transverse processes and laid a mixture of  morcellized autograft and allograft out over these to perform intertransverse arthrodesis at L4-5. We then placed lordotic rods into the multiaxial screw heads of the pedicle screws and locked these in position with the locking caps and anti-torque device. We then checked our construct with AP and lateral fluoroscopy. Irrigated with copious amounts of bacitracin-containing saline solution. Inspected the nerve roots once again to assure adequate decompression, lined to the dura with Gelfoam, and closed the muscle and the fascia with 0 Vicryl. Closed the subcutaneous tissues with 2-0 Vicryl and subcuticular tissues with 3-0 Vicryl. The skin was closed with benzoin and Steri-Strips. Dressing was then applied, the patient was awakened from general anesthesia and transported to the recovery room in stable condition. At the end of the procedure all sponge, needle and instrument counts were correct.   PLAN OF CARE: admit to inpatient  PATIENT DISPOSITION:  PACU - hemodynamically stable.   Delay start of Pharmacological VTE agent (>24hrs) due to surgical blood loss or risk of bleeding:  yes

## 2018-10-29 NOTE — Progress Notes (Signed)
Doing well after surgery.  Some numbness in the bottom of the foot but otherwise not a lot of back pain and no leg pain.  No weakness in the legs.  Good dorsi and plantar flexion.  Walked to the wheelchair to be taken to his room.  He seems pleased.

## 2018-10-29 NOTE — Anesthesia Postprocedure Evaluation (Signed)
Anesthesia Post Note  Patient: Curtis Butler  Procedure(s) Performed: Anterior Lumbar Interbody Fusion  - Lumbar four-Lumbar five, posterior instrumented fusion Lumbar four-five (N/A ) Left Lumbar five-Sacral one hemilaminectomy; Posterior Instrumented Fusion Lumbar four-five (Left Back) ABDOMINAL EXPOSURE (N/A )     Patient location during evaluation: PACU Anesthesia Type: General Level of consciousness: awake Pain management: pain level controlled Vital Signs Assessment: post-procedure vital signs reviewed and stable Respiratory status: spontaneous breathing Cardiovascular status: stable Postop Assessment: no apparent nausea or vomiting Anesthetic complications: no    Last Vitals:  Vitals:   10/29/18 1347 10/29/18 1415  BP: 109/71 108/64  Pulse: 94 89  Resp: 14 10  Temp:    SpO2: 93% 94%    Last Pain:  Vitals:   10/29/18 1347  TempSrc:   PainSc: 4                  Remi Lopata

## 2018-10-29 NOTE — Progress Notes (Signed)
Pharmacy Antibiotic Note  Curtis Butler is a 58 y.o. male admitted on 10/29/2018 with degenerative disc disease; S/P L4-5 disc fusion this afternoon.  Pharmacy has been consulted for vancomycin dosing for surgical prophylaxis. Per surgeon note, pt has no drains in place post-op.  Curtis Butler has a hx of penicillin allergy. He rec'd vancomycin 1 gm IV X 1 pre-op at 0629 AM today.  Plan: Vancomycin 1 gm IV X 1 post-op, to be given 12 hrs after pre-op vancomycin dose (give dose at 1830 this evening)  Height: 5' 6.5" (168.9 cm) Weight: 169 lb 8 oz (76.9 kg) IBW/kg (Calculated) : 64.95  Temp (24hrs), Avg:97.6 F (36.4 C), Min:97.4 F (36.3 C), Max:98 F (36.7 C)    Estimated Creatinine Clearance: 74 mL/min (by C-G formula based on SCr of 1 mg/dL).    Allergies  Allergen Reactions  . Penicillins Rash    Did it involve swelling of the face/tongue/throat, SOB, or low BP? No Did it involve sudden or severe rash/hives, skin peeling, or any reaction on the inside of your mouth or nose? Unknown Did you need to seek medical attention at a hospital or doctor's office? Unknown When did it last happen? Childhood reaction. If all above answers are "NO", may proceed with cephalosporin use.     Microbiology results: 10/24 COVID: negative 10/20 MRSA PCR: positive  Thank you for allowing pharmacy to be a part of this patient's care.  Gillermina Hu, PharmD, BCPS, [facility] Clinical Pharmacist 10/29/2018 3:58 PM

## 2018-10-29 NOTE — H&P (Signed)
Anesthesia H&P Update: History and Physical Exam reviewed; patient is OK for planned anesthetic and procedure.   Subjective: Patient is a 58 y.o. male admitted for back and leg pain. Onset of symptoms was a few years ago, gradually worsening since that time.  The pain is rated severe, and is located at the across the lower back and radiates to left lower extremity. The pain is described as aching and occurs all day. The symptoms have been progressive. Symptoms are exacerbated by exercise. MRI or CT showed lytic spondylolisthesis with stenosis L4-5 and spinal stenosis with discrimination L5-S1  Past Medical History:  Diagnosis Date  . GERD (gastroesophageal reflux disease)    prilosec otc sparingly  . Hyperlipidemia    pravastatin 40mg   . Low back pain    left low back into left leg. slipped disc he believes at L3-L4  . Lumbar foraminal stenosis    Bilateral  . Protrusion of lumbar intervertebral disc    Broad-based at L5-S1.  Marland Kitchen Spondylolisthesis, lumbar region    lytic at L4-L5.    Past Surgical History:  Procedure Laterality Date  . VASECTOMY      Prior to Admission medications   Medication Sig Start Date End Date Taking? Authorizing Provider  Cholecalciferol (VITAMIN D3) 50 MCG (2000 UT) TABS Take 2,000 Units by mouth every evening.   Yes [provider]  ibuprofen (ADVIL) 200 MG tablet Take 400-800 mg by mouth every 8 (eight) hours as needed (pain.).   Yes [provider]  pravastatin (PRAVACHOL) 40 MG tablet Take 1 tablet (40 mg total) by mouth daily. Patient taking differently: Take 40 mg by mouth every evening.  04/15/18  Yes Marin Olp, MD   Allergies  Allergen Reactions  . Penicillins Rash    Did it involve swelling of the face/tongue/throat, SOB, or low BP? No Did it involve sudden or severe rash/hives, skin peeling, or any reaction on the inside of your mouth or nose? Unknown Did you need to seek medical attention at a hospital or doctor's  office? Unknown When did it last happen? Childhood reaction. If all above answers are "NO", may proceed with cephalosporin use.     Social History   Tobacco Use  . Smoking status: Former Smoker    Packs/day: 0.25    Years: 35.00    Pack years: 8.75    Types: Cigarettes    Quit date: 11/01/2016    Years since quitting: 1.9  . Smokeless tobacco: Never Used  Substance Use Topics  . Alcohol use: Yes    Comment: 4-5 per week    Family History  Problem Relation Age of Onset  . Dementia Mother        32 in 2018- at friends home  . Hypertension Mother   . Heart disease Father        MI at 57 and killed him. didnt get medical care, cigar smoker  . Healthy Sister   . Healthy Daughter   . Healthy Son   . Cancer Maternal Grandmother        unknown  . Bone cancer Paternal Grandfather        unknown original source     Review of Systems  Positive ROS: neg  All other systems have been reviewed and were otherwise negative with the exception of those mentioned in the HPI and as above.  Objective: Vital signs in last 24 hours: Temp:  [36.3 C] 36.3 C (10/28 0559) Pulse Rate:  [68] 68 (10/28  0559) Resp:  [18] 18 (10/28 0559) BP: (145)/(101) 145/101 (10/28 0559) SpO2:  [99 %] 99 % (10/28 0559) Weight:  [76.9 kg] 76.9 kg (10/28 0630)  General Appearance: Alert, cooperative, no distress, appears stated age Head: Normocephalic, without obvious abnormality, atraumatic Eyes: PERRL, conjunctiva/corneas clear, EOM's intact    Neck: Supple, symmetrical, trachea midline Back: Symmetric, no curvature, ROM normal, no CVA tenderness Lungs:  respirations unlabored Heart: Regular rate and rhythm Abdomen: Soft, non-tender Extremities: Extremities normal, atraumatic, no cyanosis or edema Pulses: 2+ and symmetric all extremities Skin: Skin color, texture, turgor normal, no rashes or lesions  NEUROLOGIC:   Mental status: Alert and oriented x4,  no aphasia, good attention span, fund  of knowledge, and memory Motor Exam - grossly normal Sensory Exam - grossly normal Reflexes: 1+ Coordination - grossly normal Gait - grossly normal Balance - grossly normal Cranial Nerves: I: smell Not tested  II: visual acuity  OS: nl    OD: nl  II: visual fields Full to confrontation  II: pupils Equal, round, reactive to light  III,VII: ptosis None  III,IV,VI: extraocular muscles  Full ROM  V: mastication Normal  V: facial light touch sensation  Normal  V,VII: corneal reflex  Present  VII: facial muscle function - upper  Normal  VII: facial muscle function - lower Normal  VIII: hearing Not tested  IX: soft palate elevation  Normal  IX,X: gag reflex Present  XI: trapezius strength  5/5  XI: sternocleidomastoid strength 5/5  XI: neck flexion strength  5/5  XII: tongue strength  Normal    Data Review Lab Results  Component Value Date   WBC 5.5 10/21/2018   HGB 16.1 10/21/2018   HCT 47.8 10/21/2018   MCV 91.7 10/21/2018   PLT 232 10/21/2018   Lab Results  Component Value Date   NA 139 10/21/2018   K 4.3 10/21/2018   CL 102 10/21/2018   CO2 27 10/21/2018   BUN 14 10/21/2018   CREATININE 1.00 10/21/2018   GLUCOSE 110 (H) 10/21/2018   Lab Results  Component Value Date   INR 0.9 10/21/2018    Assessment/Plan:  Estimated body mass index is 26.95 kg/m as calculated from the following:   Height as of this encounter: 5' 6.5" (1.689 m).   Weight as of this encounter: 76.9 kg. Patient admitted for anterior/posterior lumbar decompression and fusion for lytic spondylolisthesis at L4-5. Patient has failed a reasonable attempt at conservative therapy.  I explained the condition and procedure to the patient and answered any questions.  Patient wishes to proceed with procedure as planned. Understands risks/ benefits and typical outcomes of procedure.   Eustace Moore 10/29/2018 12:21 PM

## 2018-10-30 ENCOUNTER — Encounter (HOSPITAL_COMMUNITY): Payer: Self-pay | Admitting: Neurological Surgery

## 2018-10-30 MED ORDER — CELECOXIB 200 MG PO CAPS: 200.0000 mg | ORAL_CAPSULE | Freq: Two times a day (BID) | ORAL | 0 refills | Status: DC

## 2018-10-30 MED ORDER — OXYCODONE HCL 10 MG PO TABS: 5.0000 mg | ORAL_TABLET | ORAL | 0 refills | Status: DC | PRN

## 2018-10-30 MED ORDER — METHOCARBAMOL 500 MG PO TABS: 500.0000 mg | ORAL_TABLET | Freq: Four times a day (QID) | ORAL | 1 refills | Status: DC | PRN

## 2018-10-30 MED FILL — Heparin Sodium (Porcine) Inj 1000 Unit/ML: INTRAMUSCULAR | Qty: 30 | Status: AC

## 2018-10-30 MED FILL — Sodium Chloride IV Soln 0.9%: INTRAVENOUS | Qty: 1000 | Status: AC

## 2018-10-30 NOTE — Discharge Instructions (Signed)

## 2018-10-30 NOTE — Evaluation (Signed)
Occupational Therapy Evaluation Patient Details Name: Curtis Butler MRN: [id number] DOB: [birth year] Today's Date: 10/30/2018    History of Present Illness Pt is a 58 yo male s/p anterior/posterior lumbar decompression and fusion for lytic spondylolisthesis at L4-5. PMHx: Lumbar stenosis.   Clinical Impression   Pt PTA: pt independent with ADL and mobility. Pt currently modified independence for ADL and mobility at this time.Pt education provided for UB/LB ADL and pt with education for toilet aide. Pt demonstrates WFLs for shoulder IR and abduction, but unable to perform without twisting.  Pt was already dressed and performed figure 4 technique for LB ADL. Back handout provided and reviewed  ADL in detail. Pt educated on: clothing between brace, never sleep in brace, set an alarm at night for medication, avoid sitting for long periods of time, correct bed positioning for sleeping, correct sequence for bed mobility, avoiding lifting more than 5 pounds and never wash directly over incision. All education is complete and patient indicates understanding. No additional OT skilled services required. OT signing off.     Follow Up Recommendations  No OT follow up    Equipment Recommendations  None recommended by OT    Recommendations for Other Services       Precautions / Restrictions Precautions Precautions: Back Precaution Booklet Issued: Yes (comment) Precaution Comments: verbally discussed handout Required Braces or Orthoses: Spinal Brace Spinal Brace: Lumbar corset Restrictions Weight Bearing Restrictions: No      Mobility Bed Mobility Overal bed mobility: Needs Assistance Bed Mobility: Rolling;Sidelying to Sit;Sit to Sidelying Rolling: Supervision Sidelying to sit: Supervision     Sit to sidelying: Supervision General bed mobility comments: pt verbally describing while performing log roll- no physical assist required.  Transfers Overall transfer level: Needs  assistance Equipment used: None Transfers: Sit to/from Stand Sit to Stand: Modified independent (Device/Increase time)              Balance Overall balance assessment: No apparent balance deficits (not formally assessed)                                         ADL either performed or assessed with clinical judgement   ADL Overall ADL's : Modified independent                                       General ADL Comments: Pt education provided for UB/LB ADL and pt with education for toilet aide. Pt demonstrates WFLs for shoulder IR, but unable to perform without twisting. Pt modified independence for ADL and mobility at this time. Pt was already dressed and performed figure 4 technique for LB ADL.     Vision Baseline Vision/History: No visual deficits Vision Assessment?: No apparent visual deficits     Perception     Praxis      Pertinent Vitals/Pain Pain Assessment: 0-10 Pain Score: 3  Pain Location: ant/post incisions Pain Descriptors / Indicators: Discomfort Pain Intervention(s): Monitored during session;Premedicated before session     Hand Dominance Right   Extremity/Trunk Assessment Upper Extremity Assessment Upper Extremity Assessment: Overall WFL for tasks assessed   Lower Extremity Assessment Lower Extremity Assessment: Overall WFL for tasks assessed   Cervical / Trunk Assessment Cervical / Trunk Assessment: Other exceptions Cervical / Trunk Exceptions: s/p lumbar sx   Communication Communication Communication: No  difficulties   Cognition Arousal/Alertness: Awake/alert Behavior During Therapy: WFL for tasks assessed/performed Overall Cognitive Status: Within Functional Limits for tasks assessed                                     General Comments  Pt reports that his spouse will be home    Exercises     Shoulder Instructions      Home Living Family/patient expects to be discharged to:: Private  residence Living Arrangements: Spouse/significant other Available Help at Discharge: Family;Available 24 hours/day Type of Home: House Home Access: Stairs to enter [employer] of Steps: 2 Entrance Stairs-Rails: Right;Left Home Layout: Two level;Able to live on main level with bedroom/bathroom     Bathroom Shower/Tub: Occupational psychologist: Standard     Home Equipment: None          Prior Functioning/Environment Level of Independence: Independent                 OT Problem List: Decreased activity tolerance;Pain      OT Treatment/Interventions:      OT Goals(Current goals can be found in the care plan section) Acute Rehab OT Goals Patient Stated Goal: home independent  OT Frequency:     Barriers to D/C:            Co-evaluation              AM-PAC OT "6 Clicks" Daily Activity     Outcome Measure Help from another person eating meals?: None Help from another person taking care of personal grooming?: None Help from another person toileting, which includes using toliet, bedpan, or urinal?: A Little Help from another person bathing (including washing, rinsing, drying)?: A Little Help from another person to put on and taking off regular upper body clothing?: None Help from another person to put on and taking off regular lower body clothing?: None 6 Click Score: 22   End of Session Equipment Utilized During Treatment: Back brace Nurse Communication: Mobility status  Activity Tolerance: Patient tolerated treatment well Patient left: in bed;with call bell/phone within reach  OT Visit Diagnosis: Unsteadiness on feet (R26.81);Pain Pain - part of body: (low back)                Time: [id number] OT Time Calculation (min): 18 min Charges:  OT General Charges $OT Visit: 1 Visit OT Evaluation $OT Eval Moderate Complexity: 1 Mod  Darryl Nestle) Marsa Aris OTR/L Acute Rehabilitation Services Pager: [phone] Office:  [place] 10/30/2018, 9:24 AM

## 2018-10-30 NOTE — Discharge Summary (Signed)
Physician Discharge Summary  Patient ID: Curtis Butler MRN: [id number] DOB/AGE: 05/31/1960 58 y.o.  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 The patient tolerated the procedure well and was taken to the recovery room and then to the floor in stable condition. The hospital course was routine. There were no complications. The wound remained clean dry and intact. Pt had appropriate back soreness. No complaints of leg pain or new N/T/W. The patient remained afebrile with stable vital signs, and tolerated a regular diet. The patient continued to increase activities, and pain was well controlled with oral pain medications.   Consults: None  Significant Diagnostic Studies:  Results for orders placed or performed during the hospital encounter of 10/25/18  Novel Coronavirus, NAA (Hosp order, Send-out to Ref Lab; TAT 18-24 hrs   Specimen: Nasopharyngeal Swab; Respiratory  Result Value Ref Range   SARS-CoV-2, NAA NOT DETECTED NOT DETECTED   Coronavirus Source NASOPHARYNGEAL     Chest 2 View  Result Date: 10/21/2018 CLINICAL DATA:  Preop evaluation for upcoming lumbar surgery EXAM: CHEST - 2 VIEW COMPARISON:  None. FINDINGS: The heart size and mediastinal contours are within normal limits. Both lungs are clear. The visualized skeletal structures are unremarkable. IMPRESSION: No active cardiopulmonary disease. Electronically Signed   By: Inez Catalina M.D.   On: 10/21/2018 09:07   Dg Lumbar Spine 2-3 Views  Result Date: 10/29/2018 CLINICAL DATA:  Anterior fusion L4-5 EXAM: LUMBAR SPINE - 2-3 VIEW; DG C-ARM 1-60 MIN COMPARISON:  MRI 09/06/2017 FINDINGS: Intraoperative spot images demonstrate anterior and posterior fusion  changes at L4-5. No visible hardware or bony complicating feature. IMPRESSION: Anterior and posterior fusion at L4-5.  No complicating feature. Electronically Signed   By: Rolm Baptise M.D.   On: 10/29/2018 12:14   Dg C-arm 1-60 Min  Result Date: 10/29/2018 CLINICAL DATA:  Anterior fusion L4-5 EXAM: LUMBAR SPINE - 2-3 VIEW; DG C-ARM 1-60 MIN COMPARISON:  MRI 09/06/2017 FINDINGS: Intraoperative spot images demonstrate anterior and posterior fusion changes at L4-5. No visible hardware or bony complicating feature. IMPRESSION: Anterior and posterior fusion at L4-5.  No complicating feature. Electronically Signed   By: Rolm Baptise M.D.   On: 10/29/2018 12:14   Dg Or Local Abdomen  Result Date: 10/29/2018 CLINICAL DATA:  ALIF L4-5 EXAM: OR LOCAL ABDOMEN COMPARISON:  07/29/2017 lumbar spine radiographs FINDINGS: Surgical plate with interlocking screws at L4 and L5. Bone cage overlies the L4-5 disc space. Surgical clips overlie the left L5 vertebral body and upper left sacrum. External curvilinear metallic wire overlies the left proximal femur, probably Bovie catheter. Rectangular radiopaque structure overlies the far left lower quadrant of the image, correlating with external structure. No unexpected radiopaque foreign body. IMPRESSION: Surgical fusion hardware overlies the L4-5 level. No unexpected radiopaque foreign body. These results were called by telephone at the time of interpretation on 10/29/2018 at 9:50 am to provider DR. DAVID Ronnald Ramp , who verbally acknowledged these results. Electronically Signed   By: Ilona Sorrel M.D.   On: 10/29/2018 09:53    Antibiotics:  Anti-infectives (From admission, onward)   Start     Dose/Rate Route Frequency Ordered Stop   10/29/18 1830  vancomycin (VANCOCIN) IVPB 1000 mg/200 mL premix     1,000 mg 200 mL/hr  over 60 Minutes Intravenous  Once 10/29/18 1555 10/29/18 1803   10/29/18 0728  bacitracin 50,000 Units in sodium chloride 0.9 % 500 mL irrigation  Status:   Discontinued       As needed 10/29/18 0829 10/29/18 1214   10/29/18 0615  vancomycin (VANCOCIN) IVPB 1000 mg/200 mL premix     1,000 mg 200 mL/hr over 60 Minutes Intravenous On call to [facility] 10/29/18 0602 10/29/18 0729      Discharge Exam: Blood pressure (!) 152/78, pulse 96, temperature 37.1 C, temperature source Oral, resp. rate 18, height 5' 6.5" (1.689 m), weight 76.9 kg, SpO2 99 %. Neurologic: Grossly normal Dressing dry  Discharge Medications:   Allergies as of 10/30/2018      Reactions   Penicillins Rash   Did it involve swelling of the face/tongue/throat, SOB, or low BP? No Did it involve sudden or severe rash/hives, skin peeling, or any reaction on the inside of your mouth or nose? Unknown Did you need to seek medical attention at a hospital or doctor's office? Unknown When did it last happen? Childhood reaction. If all above answers are "NO", may proceed with cephalosporin use.      Medication List    STOP taking these medications   ibuprofen 200 MG tablet Commonly known as: ADVIL     TAKE these medications   celecoxib 200 MG capsule Commonly known as: CELEBREX Take 1 capsule (200 mg total) by mouth every 12 (twelve) hours.   methocarbamol 500 MG tablet Commonly known as: ROBAXIN Take 1 tablet (500 mg total) by mouth every 6 (six) hours as needed for muscle spasms.   Oxycodone HCl 10 MG Tabs Take 0.5-1 tablets (5-10 mg total) by mouth every 4 (four) hours as needed for severe pain ((score 7 to 10)).   pravastatin 40 MG tablet Commonly known as: PRAVACHOL Take 1 tablet (40 mg total) by mouth daily. What changed: when to take this   Vitamin D3 50 MCG (2000 UT) Tabs Take 2,000 Units by mouth every evening.            Durable Medical Equipment  (From admission, onward)         Start     Ordered   10/29/18 1545  DME Walker rolling  Once    Question:  Patient needs a walker to treat with the following condition  Answer:  S/P lumbar fusion    10/29/18 1544   10/29/18 1545  DME 3 n 1  Once     10/29/18 1544          Disposition: home   Final Dx: L4-5 fusion  Discharge Instructions     Remove dressing in 72 hours   Complete by: As directed    Call MD for:  difficulty breathing, headache or visual disturbances   Complete by: As directed    Call MD for:  persistant nausea and vomiting   Complete by: As directed    Call MD for:  redness, tenderness, or signs of infection (pain, swelling, redness, odor or green/yellow discharge around incision site)   Complete by: As directed    Call MD for:  severe uncontrolled pain   Complete by: As directed    Call MD for:  temperature >100.4   Complete by: As directed    Diet - low sodium heart healthy   Complete by: As directed    Increase activity slowly   Complete by: As directed       Follow-up Information  Eustace Moore, MD. Schedule an appointment as soon as possible for a visit in 2 week(s).   Specialty: Neurosurgery Contact information: [address] [place] [address] [place] [address] [phone]            Signed: Eustace Moore 10/30/2018, 7:58 AM

## 2018-10-30 NOTE — Addendum Note (Signed)
Addendum  created 10/30/18 2214 by Belinda Block, MD   Intraprocedure Staff edited

## 2018-10-30 NOTE — Progress Notes (Signed)
Pt doing well. Pt given D/C instructions with verbal understanding. Rx's were sent to pharmacy by MD. Pt's incision is clean and dry with no sign of infection. Pt's IV was removed prior to D/C. Pt D/C'd home via walking per MD order. Pt is stable @ D/C and has no other needs at this time. Holli Humbles, RN

## 2018-10-30 NOTE — Progress Notes (Addendum)
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 PMHx: Lumbar stenosis.    PT Comments    Pt progressing well with post-op mobility. He was able to demonstrate transfers and ambulation with gross modified independence and no AD. Reinforced education on precautions, brace application/wearing schedule, appropriate activity progression, and car transfer. As pt has met acute PT goals at this time, will sign off, however if needs change, please reconsult.      Follow Up Recommendations  No PT follow up     Equipment Recommendations  None recommended by PT    Recommendations for Other Services       Precautions / Restrictions Precautions Precautions: Back Precaution Booklet Issued: Yes (comment) Precaution Comments: verbally discussed handout Required Braces or Orthoses: Spinal Brace Spinal Brace: Lumbar corset Restrictions Weight Bearing Restrictions: No    Mobility  Bed Mobility Overal bed mobility: Modified Independent Bed Mobility: Rolling;Sidelying to Sit           General bed mobility comments: HOB flat and rails lowered to simulate home environment.   Transfers Overall transfer level: Modified independent Equipment used: None Transfers: Sit to/from Stand           General transfer comment: No assist required. Pt demonstrated proper hand placement on seated surface for safety.   Ambulation/Gait Ambulation/Gait assistance: Modified independent (Device/Increase time) Gait Distance (Feet): 400 Feet Assistive device: None Gait Pattern/deviations: Step-through pattern Gait velocity: WFL Gait velocity interpretation: >2.62 ft/sec, indicative of community ambulatory General Gait Details: No overt LOB, good gait speed with smooth cadence     Stairs Stairs: Yes   Stair Management: One rail Right;Alternating pattern;Forwards Number of Stairs: 10 General stair comments: No difficulty to complete a full flight   Wheelchair Mobility    Modified Rankin (Stroke Patients Only)       Balance Overall balance assessment: No apparent balance deficits (not formally assessed)                                          Cognition Arousal/Alertness: Awake/alert Behavior During Therapy: WFL for tasks assessed/performed Overall Cognitive Status: Within Functional Limits for tasks assessed                                        Exercises      General Comments        Pertinent Vitals/Pain Pain Assessment: Faces Faces Pain Scale: Hurts a little bit Pain Location: ant/post incisions Pain Descriptors / Indicators: Discomfort Pain Intervention(s): Limited activity within patient's tolerance;Monitored during session;Repositioned    Home Living                      Prior Function            PT Goals (current goals can now be found in the care plan section) Acute Rehab PT Goals Patient Stated Goal: home independent PT Goal Formulation: With patient Time For Goal Achievement: 10/31/18 Potential to Achieve Goals: Good Progress towards PT goals: Goals met/education completed, patient discharged from PT    Frequency    Min 5X/week  PT Plan Current plan remains appropriate    Co-evaluation              AM-PAC PT "6 Clicks" Mobility   Outcome Measure  Help needed turning from your back to your side while in a flat bed without using bedrails?: None Help needed moving from lying on your back to sitting on the side of a flat bed without using bedrails?: None Help needed moving to and from a bed to a chair (including a wheelchair)?: None Help needed standing up from a chair using your arms (e.g., wheelchair or bedside chair)?: A Little Help needed to walk in  hospital room?: A Little Help needed climbing 3-5 steps with a railing? : A Little 6 Click Score: 21    End of Session Equipment Utilized During Treatment: Back brace Activity Tolerance: Patient tolerated treatment well Patient left: in bed;Other (comment);with call bell/phone within reach;with family/visitor present Nurse Communication: Mobility status PT Visit Diagnosis: Other abnormalities of gait and mobility (R26.89);Pain Pain - part of body: (incisions back and front)     Time: 0811-0825 PT Time Calculation (min) (ACUTE ONLY): 14 min  Charges:  $Gait Training: 8-22 mins                     Rolinda Roan, PT, DPT Acute Rehabilitation Services Pager: [phone] Office: [phone]    Thelma Comp 10/30/2018, 1:12 PM

## 2018-12-09 DIAGNOSIS — M4316 Spondylolisthesis, lumbar region: Secondary | ICD-10-CM | POA: Diagnosis not present

## 2019-04-07 DIAGNOSIS — M4316 Spondylolisthesis, lumbar region: Secondary | ICD-10-CM | POA: Diagnosis not present

## 2019-04-21 ENCOUNTER — Other Ambulatory Visit: Payer: Self-pay | Admitting: Family Medicine

## 2019-04-22 ENCOUNTER — Telehealth: Payer: Self-pay | Admitting: Family Medicine

## 2019-04-22 MED ORDER — PRAVASTATIN SODIUM 40 MG PO TABS: 40.0000 mg | ORAL_TABLET | Freq: Every day | ORAL | 3 refills | Status: DC

## 2019-04-22 NOTE — Telephone Encounter (Signed)
MEDICATION: Pravastatin 40 MG  PHARMACY: [facility] [address] at [facility] [place]  Comments:   **Let patient know to contact pharmacy at the end of the day to make sure medication is ready. **  ** Please notify patient to allow 48-72 hours to process**  **Encourage patient to contact the pharmacy for refills or they can request refills through [facility]**

## 2019-04-22 NOTE — Telephone Encounter (Signed)
Medication sent in. 

## 2020-02-21 ENCOUNTER — Other Ambulatory Visit: Payer: Self-pay | Admitting: Family Medicine

## 2020-03-10 NOTE — Patient Instructions (Addendum)
Thanks for doing labs today- just need urine real quick If you have mychart- we will send your results within 3 business days of [place] receiving them.  If you do not have mychart- we will call you about results within 5 business days of [place] receiving them.  *please also note that you will see labs on mychart as soon as they post. I will later go in and write notes on them- will say "notes from Dr. Yong Channel"  Saw palmetto can trial for prostate  Recommended follow up: Return in about 1 year (around 03/11/2021) for physical or sooner if needed.

## 2020-03-10 NOTE — Progress Notes (Signed)
Phone: [phone]   Subjective:  Patient presents today for their annual physical. Chief complaint-noted.   See problem oriented charting- ROS- full  review of systems was completed and negative per full ROS sheet  The following were reviewed and entered/updated in epic: Past Medical History:  Diagnosis Date  . GERD (gastroesophageal reflux disease)    prilosec otc sparingly  . Hyperlipidemia    pravastatin 40mg   . Low back pain    left low back into left leg. slipped disc he believes at L3-L4  . Lumbar foraminal stenosis    Bilateral  . Protrusion of lumbar intervertebral disc    Broad-based at L5-S1.  Marland Kitchen Spondylolisthesis, lumbar region    lytic at L4-L5.   Patient Active Problem List   Diagnosis Date Noted  . Former smoker 10/31/2016    Priority: High  . S/P lumbar fusion 10/29/2018    Priority: Medium  . Hyperglycemia 11/05/2016    Priority: Medium  . Low back pain     Priority: Medium  . Hyperlipidemia     Priority: Medium  . Vitamin D deficiency 10/31/2016    Priority: Low  . Allergic rhinitis 10/31/2016    Priority: Low  . GERD (gastroesophageal reflux disease)     Priority: Low  . History of colon polyps 10/31/2016   Past Surgical History:  Procedure Laterality Date  . ABDOMINAL EXPOSURE N/A 10/29/2018   Procedure: ABDOMINAL EXPOSURE;  Surgeon: Rosetta Posner, MD;  Location: [facility] OR;  Service: Vascular;  Laterality: N/A;  . ANTERIOR LUMBAR FUSION N/A 10/29/2018   Procedure: Anterior Lumbar Interbody Fusion  - Lumbar four-Lumbar five, posterior instrumented fusion Lumbar four-five;  Surgeon: Eustace Moore, MD;  Location: [place];  Service: Neurosurgery;  Laterality: N/A;  . LAMINECTOMY WITH POSTERIOR LATERAL ARTHRODESIS LEVEL 1 Left 10/29/2018   Procedure: Left Lumbar five-Sacral one hemilaminectomy; Posterior Instrumented Fusion Lumbar four-five;  Surgeon: Eustace Moore, MD;  Location: [place];  Service: Neurosurgery;  Laterality: Left;  Marland Kitchen VASECTOMY       Family History  Problem Relation Age of Onset  . Dementia Mother        93 in 2018- at friends home  . Hypertension Mother   . Heart disease Father        MI at 46 and killed him. didnt get medical care, cigar smoker  . Healthy Sister   . Healthy Daughter   . Healthy Son   . Cancer Maternal Grandmother        unknown  . Bone cancer Paternal Grandfather        unknown original source    Medications- reviewed and updated Current Outpatient Medications  Medication Sig Dispense Refill  . Multiple Vitamin (MULTI-VITAMIN PO) Take by mouth. SUPER C    . pravastatin (PRAVACHOL) 40 MG tablet TAKE 1 TABLET(40 MG) BY MOUTH DAILY 90 tablet 3   No current facility-administered medications for this visit.    Allergies-reviewed and updated Allergies  Allergen Reactions  . Penicillins Rash    Did it involve swelling of the face/tongue/throat, SOB, or low BP? No Did it involve sudden or severe rash/hives, skin peeling, or any reaction on the inside of your mouth or nose? Unknown Did you need to seek medical attention at a hospital or doctor's office? Unknown When did it last happen? Childhood reaction. If all above answers are "NO", may proceed with cephalosporin use.     Social History   Social History Narrative   Married 1989. 2 children-  36 son Corporate treasurer and 21 daughter at [place] state- in [place] now.    Moved from [place] 2017.       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 Raliegh Ip, biking, walking trails, yoga   Objective  Objective:  BP 104/64   Pulse 70   Temp 98.3 F (36.8 C) (Temporal)   Ht 5\' 7"  (1.702 m)   Wt 170 lb 12.8 oz (77.5 kg)   SpO2 95%   BMI 26.75 kg/m  Gen: NAD, resting comfortably HEENT: Mucous membranes are moist. Oropharynx normal Neck: no thyromegaly CV: RRR no murmurs rubs or gallops Lungs: CTAB no crackles, wheeze, rhonchi Abdomen:  soft/nontender/nondistended/normal bowel sounds. No rebound or guarding.  Ext: no edema Skin: warm, dry Neuro: grossly normal, moves all extremities, PERRLA   Assessment and Plan  60 y.o. male presenting for annual physical.  Health Maintenance counseling: 1. Anticipatory guidance: Patient counseled regarding regular dental exams -q6 months, eye exams -yearly,  avoiding smoking and second hand smoke , limiting alcohol to 2 beverages per day .   2. Risk factor reduction:  Advised patient of need for regular exercise and diet rich and fruits and vegetables to reduce risk of heart attack and stroke. Exercise- 4x a week- mainly walking and will start at gym- full body workout. Diet-wife joined [employer] and he eats what she makes so eating reasonably health- watching carbs and sugars.  Wt Readings from Last 3 Encounters:  03/11/20 170 lb 12.8 oz (77.5 kg)  10/29/18 169 lb 8 oz (76.9 kg)  10/21/18 169 lb 8 oz (76.9 kg)  3. Immunizations/screenings/ancillary studies-discussed hepatitis C screening- opts in.   Immunization History  Administered Date(s) Administered  . Influenza,inj,Quad PF,6+ Mos 09/17/2018, 12/01/2019  . PFIZER(Purple Top)SARS-COV-2 Vaccination 03/25/2019, 04/22/2019, 12/01/2019  . Tdap 04/08/2017  . Zoster Recombinat (Shingrix) 09/17/2018   4. Prostate cancer screening- low risk initial #- trend with labs today  Lab Results  Component Value Date   PSA 1.02 04/08/2017   5. Colon cancer screening - 07/03/12 with benign colonic mucosa vs early hyperplastic polyp with 10 year follow up needed 6. Skin cancer screening- no dermatologist. advised regular sunscreen use. Denies worrisome, changing, or new skin lesions- other than a spot on his back wife would like [place] to look at   7. FORMER smoker- cigarette free since 2018- will need AAA screen at 13. Does not qualify for lung cancer screening. Will get UA with labs  8. STD screening - only active with wife.   Status of chronic  or acute concerns   #back surgery October 2020- has done very well since that time with fusion of L4-L5    #hyperlipidemia S: Medication: Pravastatin 40Mg  Lab Results  Component Value Date   CHOL 153 04/01/2017   HDL 39.70 04/01/2017   LDLCALC 84 04/01/2017   TRIG 142.0 04/01/2017   CHOLHDL 4 04/01/2017   A/P: reasonable control on last check - update lipid panel  #Vitamin D deficiency S: Medication: MV with vitamin D - not sure of amount Last vitamin D Lab Results  Component Value Date   VD25OH 33.18 04/01/2017  A/P: hopefully controlled- update levels today   # GERD- very sparing omeprazole- not at all recently!   # allergic rhinitis- claritin as needed if having symptoms  # Hyperglycemia/insulin resistance/prediabetes S:  Medication: none Lab Results  Component Value Date   HGBA1C 5.7 04/08/2017  HGBA1C 5.8 01/21/2015   HGBA1C 5.6 07/02/2014   A/P: hopefully stable- update a1c today. Continue to work on lifestyle  Recommended follow up: Return in about 1 year (around 03/11/2021) for physical or sooner if needed.  Lab/Order associations:NOT fasting   ICD-10-CM   1. Preventative health care  Z00.00 CBC with Differential/Platelet    Comprehensive metabolic panel    Lipid panel    Lipid panel    Comprehensive metabolic panel    CBC with Differential/Platelet  2. Gastroesophageal reflux disease, unspecified whether esophagitis present  K21.9   3. Vitamin D deficiency  E55.9   4. Hyperlipidemia, unspecified hyperlipidemia type  E78.5 CBC with Differential/Platelet    Comprehensive metabolic panel    Lipid panel    Lipid panel    Comprehensive metabolic panel    CBC with Differential/Platelet  5. Hyperglycemia  R73.9 Hemoglobin A1c    Hemoglobin A1c  6. Former smoker  Z87.891 POCT Urinalysis Dipstick (Automated)  7. Encounter for hepatitis C screening test for low risk patient  Z11.59 Hepatitis C antibody    Hepatitis C antibody  8. Screening for HIV without  presence of risk factors  Z11.4 HIV Antibody (routine testing w rflx)    HIV Antibody (routine testing w rflx)    No orders of the defined types were placed in this encounter.   Return precautions advised.  Garret Reddish, MD

## 2020-03-11 ENCOUNTER — Other Ambulatory Visit: Payer: Self-pay

## 2020-03-11 ENCOUNTER — Encounter: Payer: Self-pay | Admitting: Family Medicine

## 2020-03-11 ENCOUNTER — Ambulatory Visit (INDEPENDENT_AMBULATORY_CARE_PROVIDER_SITE_OTHER): Payer: BC Managed Care – PPO | Admitting: Family Medicine

## 2020-03-11 VITALS — BP 104/64 | HR 70 | Temp 98.3°F | Ht 67.0 in | Wt 170.8 lb

## 2020-03-11 DIAGNOSIS — E785 Hyperlipidemia, unspecified: Secondary | ICD-10-CM | POA: Diagnosis not present

## 2020-03-11 DIAGNOSIS — Z87891 Personal history of nicotine dependence: Secondary | ICD-10-CM | POA: Diagnosis not present

## 2020-03-11 DIAGNOSIS — Z1159 Encounter for screening for other viral diseases: Secondary | ICD-10-CM

## 2020-03-11 DIAGNOSIS — Z114 Encounter for screening for human immunodeficiency virus [HIV]: Secondary | ICD-10-CM

## 2020-03-11 DIAGNOSIS — K219 Gastro-esophageal reflux disease without esophagitis: Secondary | ICD-10-CM

## 2020-03-11 DIAGNOSIS — Z0000 Encounter for general adult medical examination without abnormal findings: Secondary | ICD-10-CM | POA: Diagnosis not present

## 2020-03-11 DIAGNOSIS — R739 Hyperglycemia, unspecified: Secondary | ICD-10-CM | POA: Diagnosis not present

## 2020-03-11 DIAGNOSIS — E559 Vitamin D deficiency, unspecified: Secondary | ICD-10-CM

## 2020-03-11 LAB — POC URINALSYSI DIPSTICK (AUTOMATED)
Bilirubin, UA: NEGATIVE
Blood, UA: NEGATIVE
Glucose, UA: NEGATIVE
Ketones, UA: NEGATIVE
Leukocytes, UA: NEGATIVE
Nitrite, UA: NEGATIVE
Protein, UA: NEGATIVE
Spec Grav, UA: >=1.03 — AB (ref 1.010–1.025)
Urobilinogen, UA: 0.2 E.U./dL
pH, UA: 5.5 (ref 5.0–8.0)

## 2020-03-14 ENCOUNTER — Telehealth: Payer: Self-pay

## 2020-03-14 LAB — CBC WITH DIFFERENTIAL/PLATELET
Absolute Monocytes: 728 cells/uL (ref 200–950)
Basophils Absolute: 42 cells/uL (ref 0–200)
Basophils Relative: 0.6 %
Eosinophils Absolute: 133 cells/uL (ref 15–500)
Eosinophils Relative: 1.9 %
HCT: 46.5 % (ref 38.5–50.0)
Hemoglobin: 15.8 g/dL (ref 13.2–17.1)
Lymphs Abs: 2233 cells/uL (ref 850–3900)
MCH: 29.5 pg (ref 27.0–33.0)
MCHC: 34 g/dL (ref 32.0–36.0)
MCV: 86.9 fL (ref 80.0–100.0)
MPV: 11.1 fL (ref 7.5–12.5)
Monocytes Relative: 10.4 %
Neutro Abs: 3864 cells/uL (ref 1500–7800)
Neutrophils Relative %: 55.2 %
Platelets: 262 10*3/uL (ref 140–400)
RBC: 5.35 10*6/uL (ref 4.20–5.80)
RDW: 13 % (ref 11.0–15.0)
Total Lymphocyte: 31.9 %
WBC: 7 10*3/uL (ref 3.8–10.8)

## 2020-03-14 LAB — COMPREHENSIVE METABOLIC PANEL
AG Ratio: 1.6 (calc) (ref 1.0–2.5)
ALT: 18 U/L (ref 9–46)
AST: 15 U/L (ref 10–35)
Albumin: 4.5 g/dL (ref 3.6–5.1)
Alkaline phosphatase (APISO): 75 U/L (ref 35–144)
BUN: 19 mg/dL (ref 7–25)
CO2: 24 mmol/L (ref 20–32)
Calcium: 9.6 mg/dL (ref 8.6–10.3)
Chloride: 103 mmol/L (ref 98–110)
Creat: 0.97 mg/dL (ref 0.70–1.33)
Globulin: 2.8 g/dL (calc) (ref 1.9–3.7)
Glucose, Bld: 91 mg/dL (ref 65–99)
Potassium: 4.7 mmol/L (ref 3.5–5.3)
Sodium: 137 mmol/L (ref 135–146)
Total Bilirubin: 0.4 mg/dL (ref 0.2–1.2)
Total Protein: 7.3 g/dL (ref 6.1–8.1)

## 2020-03-14 LAB — HEMOGLOBIN A1C
Hgb A1c MFr Bld: 5.5 % of total Hgb (ref <5.7)
Mean Plasma Glucose: 111 mg/dL
eAG (mmol/L): 6.2 mmol/L

## 2020-03-14 LAB — LIPID PANEL
Cholesterol: 175 mg/dL (ref <200)
HDL: 38 mg/dL — ABNORMAL LOW (ref >=40)
LDL Cholesterol (Calc): 93 mg/dL (calc)
Non-HDL Cholesterol (Calc): 137 mg/dL (calc) — ABNORMAL HIGH (ref <130)
Total CHOL/HDL Ratio: 4.6 (calc) (ref <5.0)
Triglycerides: 328 mg/dL — ABNORMAL HIGH (ref <150)

## 2020-03-14 LAB — HEPATITIS C ANTIBODY
Hepatitis C Ab: NONREACTIVE
SIGNAL TO CUT-OFF: 0 (ref <1.00)

## 2020-03-14 LAB — HIV ANTIBODY (ROUTINE TESTING W REFLEX): HIV 1&2 Ab, 4th Generation: NONREACTIVE

## 2020-03-14 NOTE — Telephone Encounter (Signed)
Returned Pt's call. Unable to reach pt. [place]

## 2020-03-14 NOTE — Telephone Encounter (Signed)
Patient is returning a call from [place].  

## 2020-04-18 IMAGING — MR MR LUMBAR SPINE W/O CM
4 of 5 series · 18 of 48 positions shown · non-contrast
Comparison: Lumbar spine radiographs 07/29/2017.

CLINICAL DATA: Low back pain radiating into the left buttock and
leg for 9 weeks. No known injury or prior relevant surgery.

EXAM:
MRI LUMBAR SPINE WITHOUT CONTRAST
TECHNIQUE: Multiplanar, multisequence MR imaging of the lumbar spine was
performed. No intravenous contrast was administered.

[Series 6: T2 · sagittal · 4.0mm · 0.73mm/px · 6 of 15 slices shown (1 of 2)]
[im 1/15]
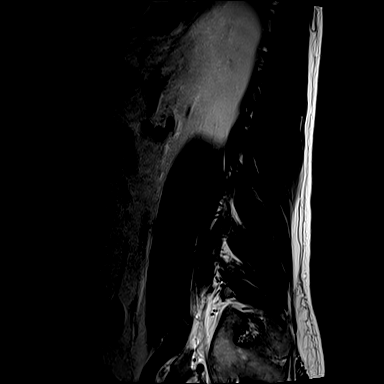
[im 3/15]
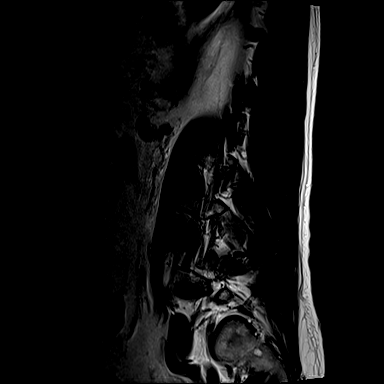
[im 6/15]
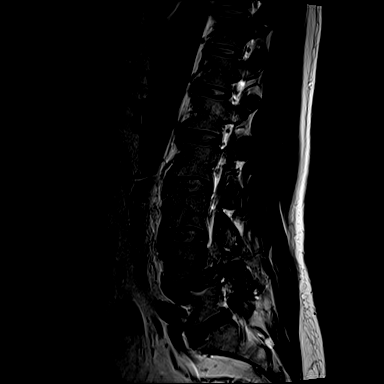
[im 9/15]
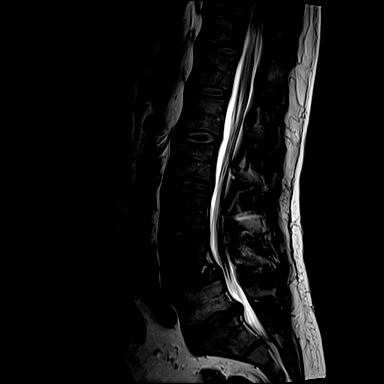
[im 12/15]
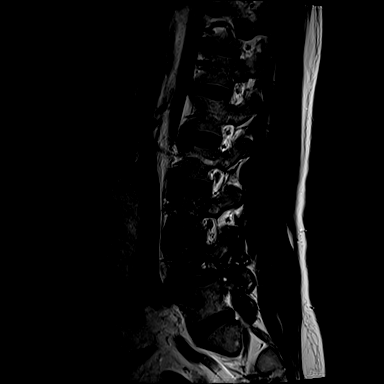
[im 15/15]
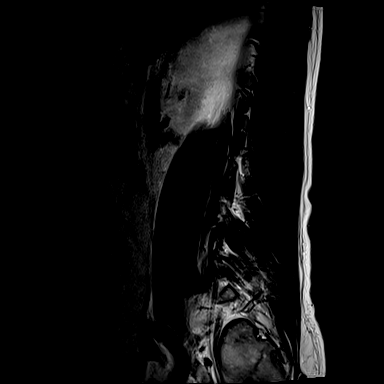

[Series 7: T1 · sagittal · 4.0mm · 0.73mm/px · 3 of 15 slices shown (1 of 2)]
[im 3/15]
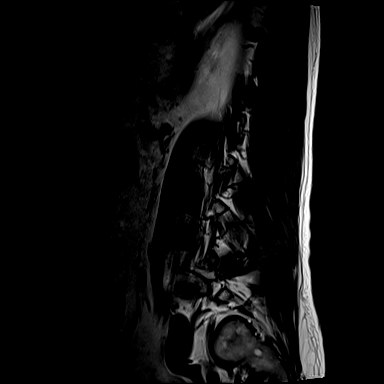
[im 9/15]
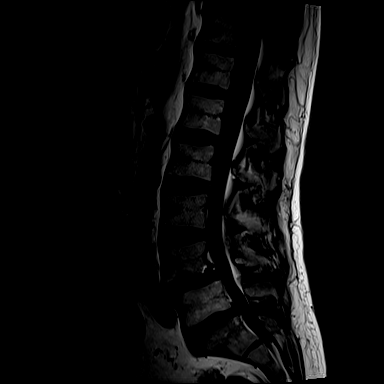
[im 15/15]
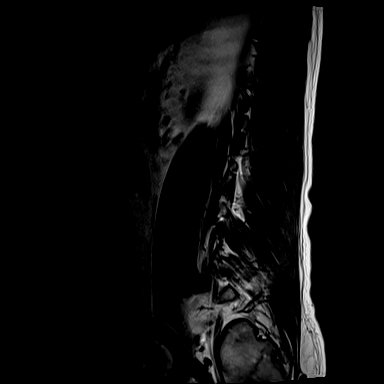

[Series 13: T2 · axial · 4.0mm · 0.28mm/px · z∈[-85,+110]mm · 6 of 41 slices shown (2 of 2)]
[im 1/41]
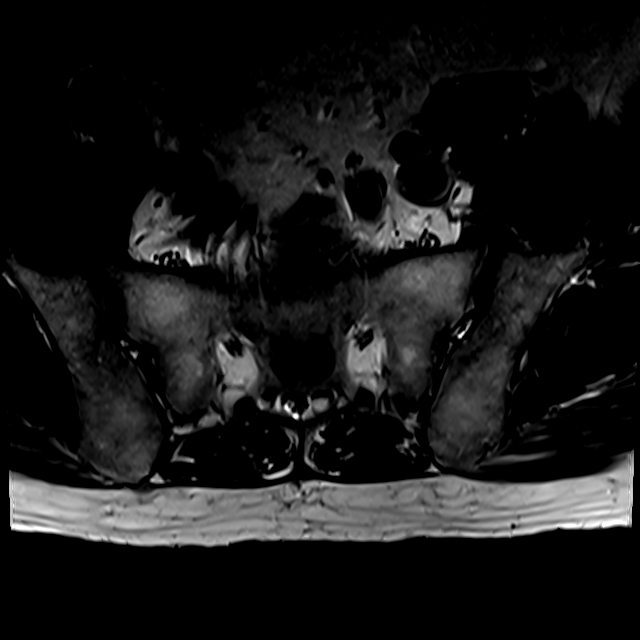
[im 6/41]
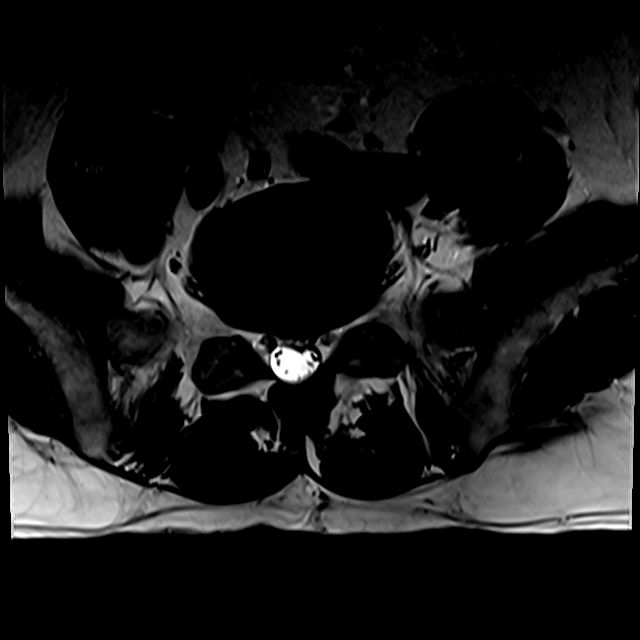
[im 12/41]
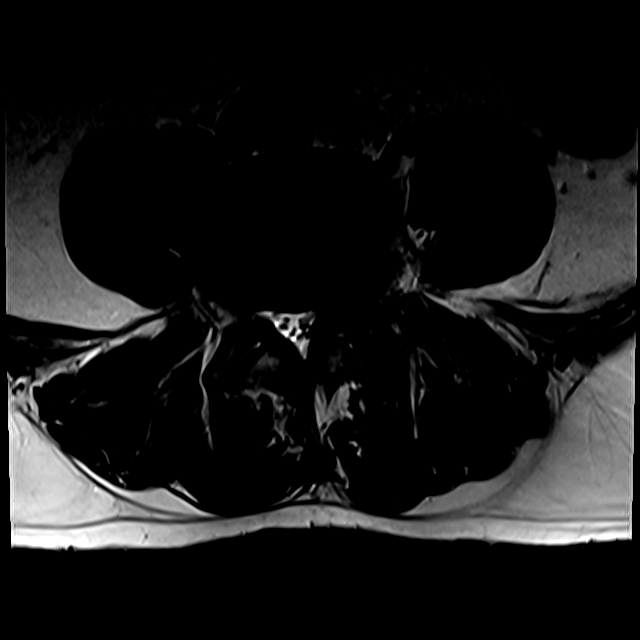
[im 18/41]
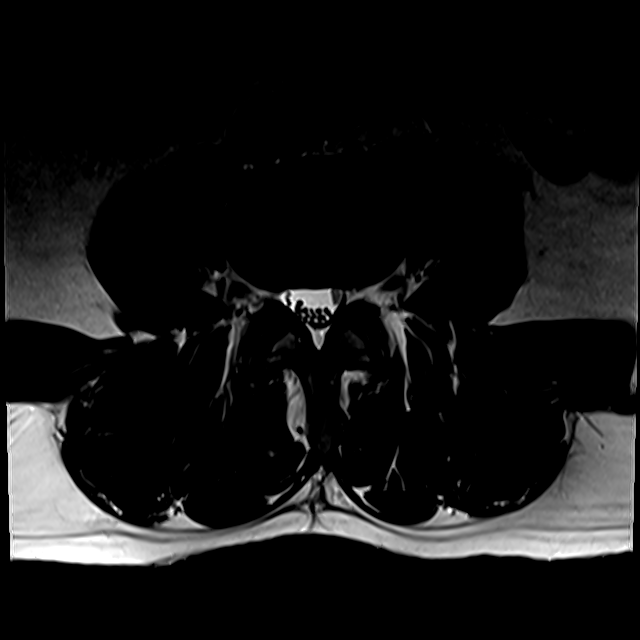
[im 21/41]
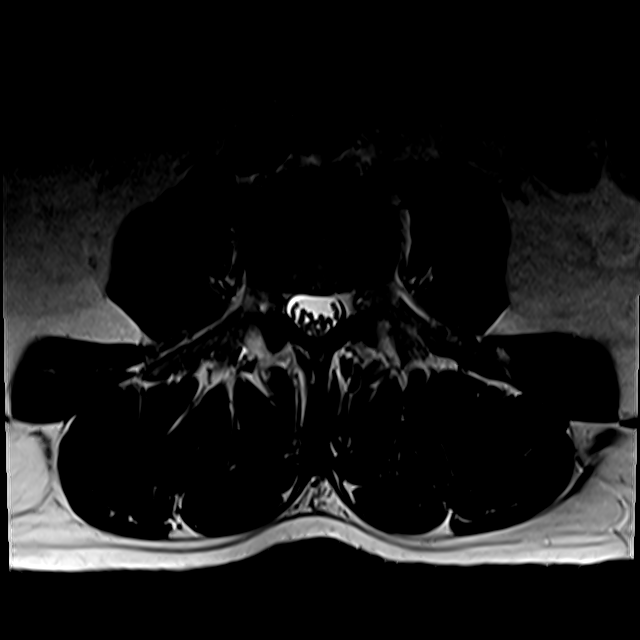
[im 35/41]
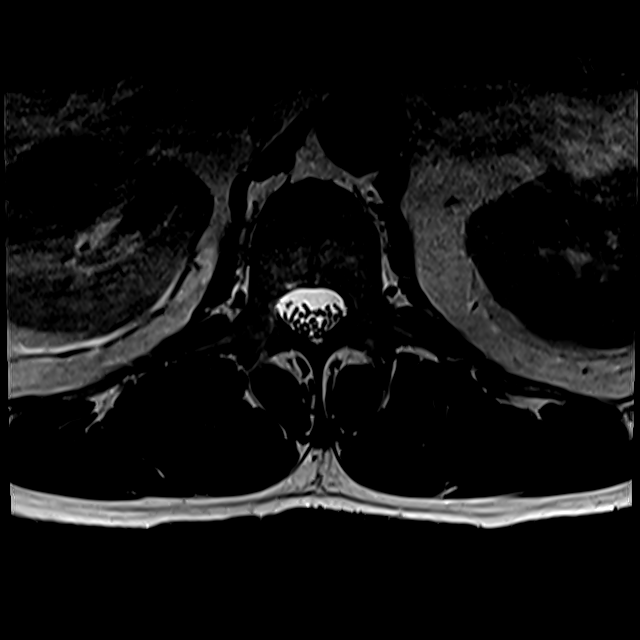

[Series 100: T1 · axial · 4.0mm · 0.28mm/px · z∈[-60,+110]mm · 3 of 41 slices shown (2 of 2)]
[im 6/41]
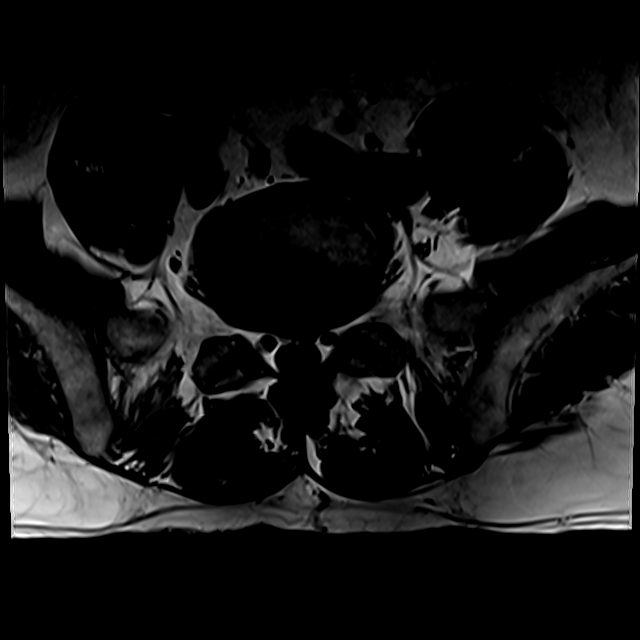
[im 21/41]
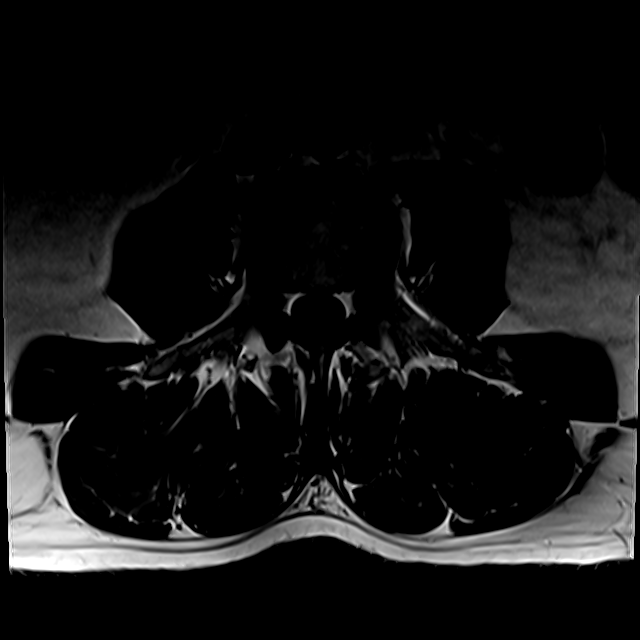
[im 35/41]
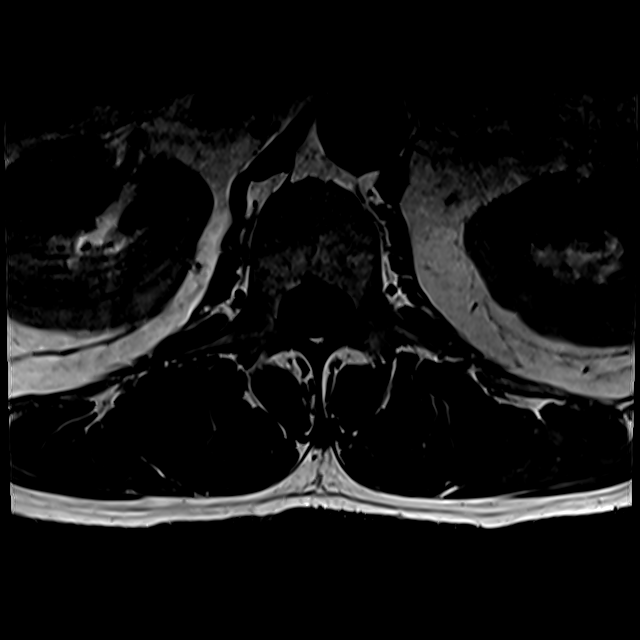

[18 of 48 positions shown; findings below may reference images not displayed]

FINDINGS: Segmentation: Conventional anatomy assumed, with the last open disc
space designated L5-S1.

Alignment: There is 8 mm of anterolisthesis at L4-5. There is
suspicion of underlying bilateral L4 pars defects (more convincing
on the right). The alignment is otherwise normal.

Vertebrae: No evidence of acute fracture or focal osseous lesion. As
above, suspected bilateral L4 pars defects. The visualized
sacroiliac joints appear unremarkable.

Conus medullaris: Extends to the L1 level and appears normal.

Paraspinal and other soft tissues: No significant paraspinal
findings.

Disc levels:

The discs are well hydrated with maintained height from T11-12
through L3-4. There is no spinal stenosis or nerve root
encroachment. There is mild facet hypertrophy at L3-4.

L4-5: There is moderate loss of disc height with annular disc
bulging and a broad-based left foraminal disc protrusion. There is
significant left foraminal narrowing and left L4 nerve root
encroachment. The right foramen is mildly narrowed. There is mild
spinal stenosis with mild narrowing of the lateral recesses, left
greater than right. As above, there is suspected bilateral L4 pars
defects (more convincing on the right). In addition, there is
moderately advanced facet hypertrophy.

L5-S1: Disc degeneration with a shallow left paracentral disc
protrusion. There is mild mass effect on the thecal sac and possible
encroachment on both S1 nerve roots. No significant foraminal
compromise or L5 nerve root encroachment.
IMPRESSION: 1. The most significant findings are at L4-5 where there is advanced
disc degeneration, a broad-based left foraminal disc protrusion and
8 mm of anterolisthesis. There is left-greater-than-right foraminal
narrowing with possible encroachment on both L4 nerve roots.
Underlying bilateral L4 pars defects are suspected.
2. Shallow left paracentral disc protrusion at L5-S1 with potential
bilateral S1 nerve root encroachment.
3. No other significant disc space findings.

## 2021-01-10 ENCOUNTER — Encounter: Payer: Self-pay | Admitting: Family Medicine

## 2021-03-05 ENCOUNTER — Other Ambulatory Visit: Payer: Self-pay | Admitting: Family Medicine

## 2021-03-07 ENCOUNTER — Telehealth: Payer: Self-pay | Admitting: Family Medicine

## 2021-03-07 NOTE — Telephone Encounter (Signed)
.. ?  Encourage patient to contact the pharmacy for refills or they can request refills through [facility] ? ?LAST APPOINTMENT DATE:  03/11/20 ? ?NEXT APPOINTMENT DATE: 06/01/21 ? ?MEDICATION: pravastatin '40mg'$  ? ?Is the patient out of medication? Yes  ? ?[facility] [address]  ? ?Let patient know to contact pharmacy at the end of the day to make sure medication is ready. ? ?Please notify patient to allow 48-72 hours to process  ?

## 2021-03-08 MED ORDER — PRAVASTATIN SODIUM 40 MG PO TABS
ORAL_TABLET | ORAL | 3 refills | Status: DC
Start: 2021-03-08 — End: 2022-03-05

## 2021-03-08 NOTE — Telephone Encounter (Signed)
Rx refilled.

## 2021-03-23 ENCOUNTER — Ambulatory Visit: Payer: BC Managed Care – PPO | Admitting: Family Medicine

## 2021-06-01 ENCOUNTER — Encounter: Payer: Self-pay | Admitting: Family Medicine

## 2021-06-01 ENCOUNTER — Ambulatory Visit (INDEPENDENT_AMBULATORY_CARE_PROVIDER_SITE_OTHER): Payer: BC Managed Care – PPO | Admitting: Family Medicine

## 2021-06-01 VITALS — BP 108/80 | HR 62 | Temp 98.5°F | Ht 67.0 in | Wt 168.8 lb

## 2021-06-01 DIAGNOSIS — E559 Vitamin D deficiency, unspecified: Secondary | ICD-10-CM

## 2021-06-01 DIAGNOSIS — Z87891 Personal history of nicotine dependence: Secondary | ICD-10-CM

## 2021-06-01 DIAGNOSIS — R739 Hyperglycemia, unspecified: Secondary | ICD-10-CM

## 2021-06-01 DIAGNOSIS — G8929 Other chronic pain: Secondary | ICD-10-CM

## 2021-06-01 DIAGNOSIS — M545 Low back pain, unspecified: Secondary | ICD-10-CM

## 2021-06-01 DIAGNOSIS — Z125 Encounter for screening for malignant neoplasm of prostate: Secondary | ICD-10-CM

## 2021-06-01 DIAGNOSIS — K625 Hemorrhage of anus and rectum: Secondary | ICD-10-CM

## 2021-06-01 DIAGNOSIS — Z0000 Encounter for general adult medical examination without abnormal findings: Secondary | ICD-10-CM

## 2021-06-01 DIAGNOSIS — E785 Hyperlipidemia, unspecified: Secondary | ICD-10-CM | POA: Diagnosis not present

## 2021-06-01 LAB — POC URINALSYSI DIPSTICK (AUTOMATED)
Bilirubin, UA: NEGATIVE
Blood, UA: NEGATIVE
Glucose, UA: NEGATIVE
Ketones, UA: NEGATIVE
Leukocytes, UA: NEGATIVE
Nitrite, UA: NEGATIVE
Protein, UA: NEGATIVE
Spec Grav, UA: 1.025 (ref 1.010–1.025)
Urobilinogen, UA: 0.2 E.U./dL
pH, UA: 6 (ref 5.0–8.0)

## 2021-06-01 MED ORDER — HYDROCORTISONE 2.5 % EX OINT: TOPICAL_OINTMENT | Freq: Two times a day (BID) | CUTANEOUS | 0 refills | Status: DC

## 2021-06-01 NOTE — Progress Notes (Signed)
Phone: [phone]   Subjective:  Patient presents today for their annual physical. Chief complaint-noted.   See problem oriented charting- Review of Systems  Constitutional:  Negative for chills and fever.  HENT:  Negative for congestion and sinus pain.   Eyes:  Negative for blurred vision and double vision.  Respiratory:  Negative for cough and shortness of breath.   Cardiovascular:  Negative for chest pain and palpitations.  Gastrointestinal:  Positive for blood in stool. Negative for abdominal pain, constipation, diarrhea, heartburn, melena, nausea and vomiting.  Genitourinary:  Negative for dysuria and frequency.  Musculoskeletal:  Negative for back pain and myalgias.  Skin:  Negative for itching and rash.  Neurological:  Negative for dizziness and headaches.  Endo/Heme/Allergies:  Negative for polydipsia. Does not bruise/bleed easily.  Psychiatric/Behavioral:  Negative for depression and suicidal ideas.    The following were reviewed and entered/updated in epic: Past Medical History:  Diagnosis Date   GERD (gastroesophageal reflux disease)    prilosec otc sparingly   Hyperlipidemia    pravastatin '40mg'$    Low back pain    left low back into left leg. slipped disc he believes at L3-L4   Lumbar foraminal stenosis    Bilateral   Protrusion of lumbar intervertebral disc    Broad-based at L5-S1.   Spondylolisthesis, lumbar region    lytic at L4-L5.   Patient Active Problem List   Diagnosis Date Noted   Former smoker 10/31/2016    Priority: High   S/P lumbar fusion 10/29/2018    Priority: Medium    Hyperglycemia 11/05/2016    Priority: Medium    Low back pain     Priority: Medium    Hyperlipidemia     Priority: Medium    Vitamin D deficiency 10/31/2016    Priority: Low   Allergic rhinitis 10/31/2016    Priority: Low   GERD (gastroesophageal reflux disease)     Priority: Low   History of colon polyps 10/31/2016   Past Surgical History:  Procedure Laterality  Date   ABDOMINAL EXPOSURE N/A 10/29/2018   Procedure: ABDOMINAL EXPOSURE;  Surgeon: Rosetta Posner, MD;  [facility];  Service: Vascular;  Laterality: N/A;   ANTERIOR LUMBAR FUSION N/A 10/29/2018   Procedure: Anterior Lumbar Interbody Fusion  - Lumbar four-Lumbar five, posterior instrumented fusion Lumbar four-five;  Surgeon: Eustace Moore, MD;  Location: [place];  Service: Neurosurgery;  Laterality: N/A;   LAMINECTOMY WITH POSTERIOR LATERAL ARTHRODESIS LEVEL 1 Left 10/29/2018   Procedure: Left Lumbar five-Sacral one hemilaminectomy; Posterior Instrumented Fusion Lumbar four-five;  Surgeon: Eustace Moore, MD;  Location: [place];  Service: Neurosurgery;  Laterality: Left;   VASECTOMY      Family History  Problem Relation Age of Onset   Dementia Mother        14 in 2018- at friends home   Hypertension Mother    Heart disease Father        MI at 63 and killed him. didnt get medical care, cigar smoker   Healthy Sister    Healthy Daughter    Healthy Son    Cancer Maternal Grandmother        unknown   Bone cancer Paternal Grandfather        unknown original source    Medications- reviewed and updated Current Outpatient Medications  Medication Sig Dispense Refill   Multiple Vitamin (MULTI-VITAMIN PO) Take by mouth. SUPER C     pravastatin (PRAVACHOL) 40 MG tablet TAKE 1 TABLET(40 MG)  BY MOUTH DAILY 90 tablet 3   No current facility-administered medications for this visit.    Allergies-reviewed and updated Allergies  Allergen Reactions   Penicillins Rash    Did it involve swelling of the face/tongue/throat, SOB, or low BP? No Did it involve sudden or severe rash/hives, skin peeling, or any reaction on the inside of your mouth or nose? Unknown Did you need to seek medical attention at a hospital or doctor's office? Unknown When did it last happen? Childhood reaction.       If all above answers are "NO", may proceed with cephalosporin use.     Social History   Social History  Narrative   Married 1989. 2 children- 32 son Corporate treasurer and 21 daughter at [employer]- in [place] now.    Moved from [place] 2017.       [employer] account sales- Personnel officer: golf with Dr. Raliegh Ip, biking, walking trails, yoga   Objective  Objective:  BP 108/80   Pulse 62   Temp 98.5 F (36.9 C)   Ht '5\' 7"'$  (1.702 m)   Wt 168 lb 12.8 oz (76.6 kg)   SpO2 99%   BMI 26.44 kg/m  Gen: NAD, resting comfortably HEENT: Mucous membranes are moist. Oropharynx normal Neck: no thyromegaly CV: RRR no murmurs rubs or gallops Lungs: CTAB no crackles, wheeze, rhonchi Abdomen: soft/nontender/nondistended/normal bowel sounds. No rebound or guarding.  Ext: no edema Skin: warm, dry Neuro: grossly normal, moves all extremities, PERRLA GU: hemorrhoid at 9 o clock nonthrombosed and nontender   Assessment and Plan  61 y.o. male presenting for annual physical.  Health Maintenance counseling: 1. Anticipatory guidance: Patient counseled regarding regular dental exams -q6 months, eye exams -yearly,  avoiding smoking and second hand smoke , limiting alcohol to 2 beverages per day - 2-3 per week, no illicit drugs.   2. Risk factor reduction:  Advised patient of need for regular exercise and diet rich and fruits and vegetables to reduce risk of heart attack and stroke.  Exercise- 4 days a week still- getting more golf than gym now with better weather- also getting walks in, likes stairstep at gym and weights plus stretching.  Diet/weight management- did whole 30 in January and dropped 11 lbs- felt great- weight has stabilized back out- took 3 months of going back to sugar/carbs- may go back to it. Wants to stay under 165 on home scales- 164 at home right now.  Wt Readings from Last 3 Encounters:  06/01/21 168 lb 12.8 oz (76.6 kg)  03/11/20 170 lb 12.8 oz (77.5 kg)  10/29/18 169 lb 8 oz (76.9 kg)  3. Immunizations/screenings/ancillary  studies- needs final shingrix at pharmacy Immunization History  Administered Date(s) Administered   Influenza,inj,Quad PF,6+ Mos 09/17/2018, 12/01/2019   PFIZER(Purple Top)SARS-COV-2 Vaccination 03/25/2019, 04/22/2019, 12/01/2019   Tdap 04/08/2017   Zoster Recombinat (Shingrix) 09/17/2018  4. Prostate cancer screening- will trend PSA with labs today. Had been told BPH years ago Lab Results  Component Value Date   PSA 1.02 04/08/2017   5. Colon cancer screening - refer to GI given rectal bleeding though suspect hemorrhoid related 6. Skin cancer screening- no dermatologist. advised regular sunscreen use. Denies worrisome, changing, or new skin lesions- other than spot on central back wife wanted looked at 7. Smoking associated screening (lung cancer screening, AAA screen 65-75, UA)- former smoker- under 10 pack years quit 2018 8. STD screening - only active  with wife   Status of chronic or acute concerns   #back still doing well after 10/2018 surgeyr other than reduced flexibility  #hemorrhoids- blood in toilet bright red quite often about twice a week at least. Didn't tolerate cream. Tried hem healer online- reduces swelling but still bleeding. No pain or itching. Physically feels the hemorrhoid. Colonoscopy 07/03/12 -does have hemorrhoid at 9 o clock that is nontender- advised trial of hydrocortisone 2.5% for a week twice a day- also agree with him on referring back to GI for their opinion and possible early colonoscopy   #Left trigger finger- over 6 months- left hand- bought a splint that he wears just at night but only intermittently- ring finger.    #mole on midback- encouraged him to have wife keep eye on this- only 1-2 mm circular brown lesion- likely typical   #hyperlipidemia S: Medication:pravastatin 40 mg , fis oil  Lab Results  Component Value Date   CHOL 175 03/11/2020   HDL 38 (L) 03/11/2020   LDLCALC 93 03/11/2020   TRIG 328 (H) 03/11/2020   CHOLHDL 4.6 03/11/2020    A/P: hopefully stable or improved- update lipid panel today. Continue current meds for now  #Vitamin D deficiency S: Medication: in his super c Last vitamin D  A/P: controlled last year. hopefully stable- update vitamin D today. Continue current meds for now   # Hyperglycemia/insulin resistance/prediabetes S:  Medication:  none Lab Results  Component Value Date   HGBA1C 5.5 03/11/2020   HGBA1C 5.7 04/08/2017   HGBA1C 5.8 01/21/2015  A/P: hopefully stable- has been doing well lately- update a1c with labs  Recommended follow up: Return in about 1 year (around 06/02/2022) for physical or sooner if needed.Schedule b4 you leave.  Lab/Order associations: fasting   ICD-10-CM   1. Preventative health care  Z00.00     2. Hyperlipidemia, unspecified hyperlipidemia type  E78.5     3. Chronic left-sided low back pain without sciatica  M54.50    G89.29     4. Hyperglycemia  R73.9     5. Screening for prostate cancer  Z12.5     6. Vitamin D deficiency  E55.9       No orders of the defined types were placed in this encounter.   Return precautions advised.  Garret Reddish, MD

## 2021-06-01 NOTE — Patient Instructions (Addendum)
Call back for nurse visit for final shingrix or get at pharmacy and let us know  [facility] contact Please call to schedule visit and/or procedure Address: [address], [place], [place] [address] Phone: [phone]   We can refer you to sports medicine or orthopedics if the finger does not do better with regular double Band-Aid splinting  -does have hemorrhoid at 9 o clock that is nontender- advised trial of hydrocortisone 2.5% for a week twice a day- also agree with him on referring back to GI for their opinion and possible early colonoscopy   Please stop by lab before you go If you have mychart- we will send your results within 3 business days of [place] receiving them.  If you do not have mychart- we will call you about results within 5 business days of [place] receiving them.  *please also note that you will see labs on mychart as soon as they post. I will later go in and write notes on them- will say "notes from Dr. Yong Channel"   Recommended follow up: Return in about 1 year (around 06/02/2022) for physical or sooner if needed.Schedule b4 you leave.

## 2021-06-02 LAB — CBC WITH DIFFERENTIAL/PLATELET
Basophils Absolute: 0.1 K/uL (ref 0.0–0.1)
Basophils Relative: 1 % (ref 0.0–3.0)
Eosinophils Absolute: 0.1 K/uL (ref 0.0–0.7)
Eosinophils Relative: 2.1 % (ref 0.0–5.0)
HCT: 47.4 % (ref 39.0–52.0)
Hemoglobin: 15.9 g/dL (ref 13.0–17.0)
Lymphocytes Relative: 32.4 % (ref 12.0–46.0)
Lymphs Abs: 2 K/uL (ref 0.7–4.0)
MCHC: 33.6 g/dL (ref 30.0–36.0)
MCV: 88.9 fl (ref 78.0–100.0)
Monocytes Absolute: 0.6 K/uL (ref 0.1–1.0)
Monocytes Relative: 9.5 % (ref 3.0–12.0)
Neutro Abs: 3.4 K/uL (ref 1.4–7.7)
Neutrophils Relative %: 55 % (ref 43.0–77.0)
Platelets: 247 K/uL (ref 150.0–400.0)
RBC: 5.33 Mil/uL (ref 4.22–5.81)
RDW: 13.5 % (ref 11.5–15.5)
WBC: 6.1 K/uL (ref 4.0–10.5)

## 2021-06-02 LAB — COMPREHENSIVE METABOLIC PANEL WITH GFR
ALT: 18 U/L (ref 0–53)
AST: 16 U/L (ref 0–37)
Albumin: 4.5 g/dL (ref 3.5–5.2)
Alkaline Phosphatase: 66 U/L (ref 39–117)
BUN: 16 mg/dL (ref 6–23)
CO2: 27 meq/L (ref 19–32)
Calcium: 9.8 mg/dL (ref 8.4–10.5)
Chloride: 103 meq/L (ref 96–112)
Creatinine, Ser: 0.96 mg/dL (ref 0.40–1.50)
GFR: 85.84 mL/min
Glucose, Bld: 93 mg/dL (ref 70–99)
Potassium: 4.1 meq/L (ref 3.5–5.1)
Sodium: 138 meq/L (ref 135–145)
Total Bilirubin: 0.9 mg/dL (ref 0.2–1.2)
Total Protein: 7.8 g/dL (ref 6.0–8.3)

## 2021-06-02 LAB — HEMOGLOBIN A1C: Hgb A1c MFr Bld: 5.8 % (ref 4.6–6.5)

## 2021-06-02 LAB — VITAMIN D 25 HYDROXY (VIT D DEFICIENCY, FRACTURES): VITD: 35.48 ng/mL (ref 30.00–100.00)

## 2021-06-02 LAB — LIPID PANEL
Cholesterol: 176 mg/dL (ref 0–200)
HDL: 50.5 mg/dL (ref >39.00)
NonHDL: 125.74
Total CHOL/HDL Ratio: 3
Triglycerides: 204 mg/dL — ABNORMAL HIGH (ref 0.0–149.0)
VLDL: 40.8 mg/dL — ABNORMAL HIGH (ref 0.0–40.0)

## 2021-06-02 LAB — PSA: PSA: 1.33 ng/mL (ref 0.10–4.00)

## 2021-06-02 LAB — LDL CHOLESTEROL, DIRECT: Direct LDL: 99 mg/dL

## 2021-06-12 ENCOUNTER — Encounter: Payer: Self-pay | Admitting: Gastroenterology

## 2021-07-28 ENCOUNTER — Encounter: Payer: Self-pay | Admitting: Gastroenterology

## 2021-07-28 ENCOUNTER — Ambulatory Visit: Payer: BC Managed Care – PPO | Admitting: Gastroenterology

## 2021-07-28 VITALS — BP 118/78 | HR 97 | Ht 67.0 in | Wt 166.1 lb

## 2021-07-28 DIAGNOSIS — K921 Melena: Secondary | ICD-10-CM

## 2021-07-28 DIAGNOSIS — Z1211 Encounter for screening for malignant neoplasm of colon: Secondary | ICD-10-CM | POA: Diagnosis not present

## 2021-07-28 MED ORDER — NA SULFATE-K SULFATE-MG SULF 17.5-3.13-1.6 GM/177ML PO SOLN
1.0000 | Freq: Once | ORAL | 0 refills | Status: AC
Start: 2021-07-28 — End: 2021-07-28

## 2021-07-28 NOTE — Progress Notes (Signed)
HPI : Curtis Butler is a very pleasant 61 year old male with a history of tobacco use who is referred to [place] by Dr. Garret Reddish for further evaluation of painless hematochezia.  He started seeing blood at least 6 months ago.  Initially, the bleeding was frequent and profuse, but in the past few weeks, the bleeding has been light and very infrequent.  He describes the blood as bright red in color, noted on the toilet paper, on stool and dripping from the anus.  He denies symptoms of painful passage of stool.  He does report feeling soft bulges around the anal area which he presumed to be hemorrhoids.  He denies having to manually reduce prolapsed internal hemorrhoids.  He denies itching or burning sensation around the anus. He has regular bowel movements, with formed soft stools at least once a day.  Denies constipation/straining or hard stools.  He does admit to sitting on the toilet for prolonged periods to catch up on email.  Denies abdominal pain or diarrhea. He was prescribed topical hydocortisone and this has helped reduce his bleeding.  He reports having a colonoscopy 10 years ago in [place], [place].  He reports having polyps removed but was told they were not significant and was recommended to repeat in 10 years. He denies any family history of colon cancer.    Past Medical History:  Diagnosis Date   GERD (gastroesophageal reflux disease)    prilosec otc sparingly   Hyperlipidemia    pravastatin '40mg'$    Low back pain    left low back into left leg. slipped disc he believes at L3-L4   Lumbar foraminal stenosis    Bilateral   Protrusion of lumbar intervertebral disc    Broad-based at L5-S1.   Spondylolisthesis, lumbar region    lytic at L4-L5.     Past Surgical History:  Procedure Laterality Date   ABDOMINAL EXPOSURE N/A 10/29/2018   Procedure: ABDOMINAL EXPOSURE;  Surgeon: Rosetta Posner, MD;  Location[facility];  Service: Vascular;  Laterality: N/A;   ANTERIOR LUMBAR FUSION N/A  10/29/2018   Procedure: Anterior Lumbar Interbody Fusion  - Lumbar four-Lumbar five, posterior instrumented fusion Lumbar four-five;  Surgeon: Eustace Moore, MD;  Location: [place];  Service: Neurosurgery;  Laterality: N/A;   LAMINECTOMY WITH POSTERIOR LATERAL ARTHRODESIS LEVEL 1 Left 10/29/2018   Procedure: Left Lumbar five-Sacral one hemilaminectomy; Posterior Instrumented Fusion Lumbar four-five;  Surgeon: Eustace Moore, MD;  Location: [place];  Service: Neurosurgery;  Laterality: Left;   VASECTOMY     Family History  Problem Relation Age of Onset   Dementia Mother        95 in 2018- at friends home   Hypertension Mother    Heart disease Father        MI at 45 and killed him. didnt get medical care, cigar smoker   Healthy Sister    Healthy Daughter    Healthy Son    Cancer Maternal Grandmother        unknown   Bone cancer Paternal Grandfather        unknown original source   Social History   Tobacco Use   Smoking status: Former    Packs/day: 0.25    Years: 35.00    Total pack years: 8.75    Types: Cigarettes    Quit date: 11/01/2016    Years since quitting: 4.7   Smokeless tobacco: Never  Vaping Use   Vaping Use: Never used  Substance  Use Topics   Alcohol use: Yes    Comment: 4-5 per week   Drug use: No   Current Outpatient Medications  Medication Sig Dispense Refill   hydrocortisone 2.5 % ointment Apply topically 2 (two) times daily. For 1 week 30 g 0   Multiple Vitamin (MULTI-VITAMIN PO) Take by mouth. SUPER C     pravastatin (PRAVACHOL) 40 MG tablet TAKE 1 TABLET(40 MG) BY MOUTH DAILY 90 tablet 3   No current facility-administered medications for this visit.   Allergies  Allergen Reactions   Penicillins Rash    Did it involve swelling of the face/tongue/throat, SOB, or low BP? No Did it involve sudden or severe rash/hives, skin peeling, or any reaction on the inside of your mouth or nose? Unknown Did you need to seek medical attention at a hospital or doctor's  office? Unknown When did it last happen? Childhood reaction.       If all above answers are "NO", may proceed with cephalosporin use.      Review of Systems: All systems reviewed and negative except where noted in HPI.    No results found.  Physical Exam: BP 118/78   Pulse 97   Ht '5\' 7"'$  (1.702 m)   Wt 166 lb 2 oz (75.4 kg)   SpO2 98%   BMI 26.02 kg/m  Constitutional: Pleasant,well-developed, Caucasian male in no acute distress. HEENT: Normocephalic and atraumatic. Conjunctivae are normal. No scleral icterus. Cardiovascular: Normal rate, regular rhythm.  Pulmonary/chest: Effort normal and breath sounds normal. No wheezing, rales or rhonchi. Abdominal: Soft, nondistended, nontender. Bowel sounds active throughout. There are no masses palpable. No hepatomegaly. Extremities: no edema Rectal: Deferred until time of colonoscopy Neurological: Alert and oriented to person place and time. Skin: Skin is warm and dry. No rashes noted. Psychiatric: Normal mood and affect. Behavior is normal.  CBC    Component Value Date/Time   WBC 6.1 06/01/2021 1633   RBC 5.33 06/01/2021 1633   HGB 15.9 06/01/2021 1633   HCT 47.4 06/01/2021 1633   PLT 247.0 06/01/2021 1633   MCV 88.9 06/01/2021 1633   MCH 29.5 03/11/2020 1630   MCHC 33.6 06/01/2021 1633   RDW 13.5 06/01/2021 1633   LYMPHSABS 2.0 06/01/2021 1633   MONOABS 0.6 06/01/2021 1633   EOSABS 0.1 06/01/2021 1633   BASOSABS 0.1 06/01/2021 1633    CMP     Component Value Date/Time   NA 138 06/01/2021 1633   K 4.1 06/01/2021 1633   CL 103 06/01/2021 1633   CO2 27 06/01/2021 1633   GLUCOSE 93 06/01/2021 1633   BUN 16 06/01/2021 1633   CREATININE 0.96 06/01/2021 1633   CREATININE 0.97 03/11/2020 1630   CALCIUM 9.8 06/01/2021 1633   PROT 7.8 06/01/2021 1633   ALBUMIN 4.5 06/01/2021 1633   AST 16 06/01/2021 1633   ALT 18 06/01/2021 1633   ALKPHOS 66 06/01/2021 1633   BILITOT 0.9 06/01/2021 1633   GFRNONAA >60 10/21/2018 0838    GFRAA >60 10/21/2018 0838     ASSESSMENT AND PLAN: 61 year old male with 6+ months of intermittent painless bright red blood per rectum, recently improved with addition of hydrocortisone cream.  His hematochezia seems very consistent with hemorrhoidal bleeding, but a colonoscopy is warranted to definitively exclude other etiologies.  He is due for average risk screening colonoscopy as well. We discussed the anatomy, pathophysiology and management of internal and external hemorrhoids.  I recommended he avoid sitting on the toilet for prolonged periods, otherwise  his bowel habits seem pretty well optimized, and I don't think fiber supplementation would be helpful.  I explained the role of hemorrhoidal banding for persistent hemorrhoidal symptoms.  I recommended he use topical hydrocortisone sparingly to avoid skin atrophy (no more than 2 weeks at a time).  Colon cancer screening - Colonoscopy  Hematochezia, likely hemorrhoids - Colonoscopy to exclude other etiologies - Limit time on toilet - Use topical therapies sparingly  The details, risks (including bleeding, perforation, infection, missed lesions, medication reactions and possible hospitalization or surgery if complications occur), benefits, and alternatives to colonoscopy with possible biopsy and possible polypectomy were discussed with the patient and he consents to proceed.   Elizeth Weinrich E. Candis Schatz, MD [facility]   CC:  Marin Olp, MD

## 2021-07-28 NOTE — Patient Instructions (Addendum)
If you are age 61 or older, your body mass index should be between 23-30. Your Body mass index is 26.02 kg/m². If this is out of the aforementioned range listed, please consider follow up with your Primary Care Provider. ° °If you are age 64 or younger, your body mass index should be between 19-25. Your Body mass index is 26.02 kg/m². If this is out of the aformentioned range listed, please consider follow up with your Primary Care Provider.  ° °You have been scheduled for a colonoscopy. Please follow written instructions given to you at your visit today.  °Please pick up your prep supplies at the pharmacy within the next 1-3 days. °If you use inhalers (even only as needed), please bring them with you on the day of your procedure.  ° °The [facility] providers would like to encourage you to use MYCHART to communicate with providers for non-urgent requests or questions.  Due to long hold times on the telephone, sending your provider a message by MYCHART may be a faster and more efficient way to get a response.  Please allow 48 business hours for a response.  Please remember that this is for non-urgent requests.  ° °It was a pleasure to see you today! ° °Thank you for trusting me with your gastrointestinal care!   ° °Scott E.Cunningham, MD  ° °

## 2021-08-02 ENCOUNTER — Encounter: Payer: Self-pay | Admitting: Gastroenterology

## 2021-08-02 ENCOUNTER — Ambulatory Visit (AMBULATORY_SURGERY_CENTER): Payer: BC Managed Care – PPO | Admitting: Gastroenterology

## 2021-08-02 VITALS — BP 125/74 | HR 60 | Temp 97.5°F | Resp 13 | Ht 67.0 in | Wt 166.0 lb

## 2021-08-02 DIAGNOSIS — K635 Polyp of colon: Secondary | ICD-10-CM | POA: Diagnosis not present

## 2021-08-02 DIAGNOSIS — D125 Benign neoplasm of sigmoid colon: Secondary | ICD-10-CM

## 2021-08-02 DIAGNOSIS — Z1211 Encounter for screening for malignant neoplasm of colon: Secondary | ICD-10-CM

## 2021-08-02 MED ORDER — SODIUM CHLORIDE 0.9 % IV SOLN: 500.0000 mL | Freq: Once | INTRAVENOUS | Status: DC

## 2021-08-02 NOTE — Progress Notes (Unsigned)
Called to room to assist during endoscopic procedure.  Patient ID and intended procedure confirmed with present staff. Received instructions for my participation in the procedure from the performing physician.  

## 2021-08-02 NOTE — Op Note (Signed)
[place] Patient Name: Curtis Butler Procedure Date: 08/02/2021 10:49 AM MRN: [id number] Endoscopist: Nicki Reaper E. Candis Schatz , MD Age: 61 Referring MD:  Date of Birth: 1960/03/21 Gender: Male Account #: [id number] Procedure:                Colonoscopy Indications:              Screening for colorectal malignant neoplasm (last                            colonoscopy was more than 10 years ago), incidental                            hematochezia Medicines:                Monitored Anesthesia Care Procedure:                Pre-Anesthesia Assessment:                           - Prior to the procedure, a History and Physical                            was performed, and patient medications and                            allergies were reviewed. The patient's tolerance of                            previous anesthesia was also reviewed. The risks                            and benefits of the procedure and the sedation                            options and risks were discussed with the patient.                            All questions were answered, and informed consent                            was obtained. Prior Anticoagulants: The patient has                            taken no previous anticoagulant or antiplatelet                            agents. ASA Grade Assessment: II - A patient with                            mild systemic disease. After reviewing the risks                            and benefits, the patient was deemed in  satisfactory condition to undergo the procedure.                           After obtaining informed consent, the colonoscope                            was passed under direct vision. Throughout the                            procedure, the patient's blood pressure, pulse, and                            oxygen saturations were monitored continuously. The                            Olympus CF-HQ190L (Serial# [id number])  Colonoscope was                            introduced through the anus and advanced to the the                            terminal ileum, with identification of the                            appendiceal orifice and IC valve. The colonoscopy                            was performed without difficulty. The patient                            tolerated the procedure well. The quality of the                            bowel preparation was adequate. The terminal ileum,                            ileocecal valve, appendiceal orifice, and rectum                            were photographed. The bowel preparation used was                            SUPREP via split dose instruction. Scope In: 10:52:07 AM Scope Out: 11:09:57 AM Scope Withdrawal Time: 0 hours 9 minutes 19 seconds  Total Procedure Duration: 0 hours 17 minutes 50 seconds  Findings:                 The perianal and digital rectal examinations were                            normal. Pertinent negatives include normal                            sphincter tone and no palpable rectal lesions.  A 3 mm polyp was found in the sigmoid colon. The                            polyp was flat. The polyp was removed with a cold                            snare. Resection and retrieval were complete.                            Estimated blood loss was minimal.                           Many small and large-mouthed diverticula were found                            in the sigmoid colon, descending colon and                            transverse colon. There was evidence of an impacted                            diverticulum. There was no evidence of diverticular                            bleeding.                           The exam was otherwise normal throughout the                            examined colon.                           The terminal ileum appeared normal.                           Non-bleeding internal  hemorrhoids were found during                            retroflexion. The hemorrhoids were large and Grade                            I (internal hemorrhoids that do not prolapse).                           No additional abnormalities were found on                            retroflexion. Complications:            No immediate complications. Estimated Blood Loss:     Estimated blood loss was minimal. Impression:               - One 3 mm polyp in the sigmoid colon, removed with  a cold snare. Resected and retrieved.                           - Severe diverticulosis in the sigmoid colon, in                            the descending colon and in the transverse colon.                            There was evidence of an impacted diverticulum.                            There was no evidence of diverticular bleeding.                           - The examined portion of the ileum was normal.                           - Non-bleeding internal hemorrhoids. Recommendation:           - Patient has a contact number available for                            emergencies. The signs and symptoms of potential                            delayed complications were discussed with the                            patient. Return to normal activities tomorrow.                            Written discharge instructions were provided to the                            patient.                           - Resume previous diet.                           - Continue present medications.                           - Await pathology results.                           - Repeat colonoscopy (date not yet determined) for                            surveillance based on pathology results. Dhyan Noah E. Candis Schatz, MD 08/02/2021 11:17:43 AM This report has been signed electronically.

## 2021-08-02 NOTE — Progress Notes (Signed)
To pacu, VSS. Report to [email or web address].tb 

## 2021-08-02 NOTE — Patient Instructions (Signed)
YOU HAD AN ENDOSCOPIC PROCEDURE TODAY AT [facility]:   Refer to the procedure report that was given to you for any specific questions about what was found during the examination.  If the procedure report does not answer your questions, please call your gastroenterologist to clarify.  If you requested that your care partner not be given the details of your procedure findings, then the procedure report has been included in a sealed envelope for you to review at your convenience later.  YOU SHOULD EXPECT: Some feelings of bloating in the abdomen. Passage of more gas than usual.  Walking can help get rid of the air that was put into your GI tract during the procedure and reduce the bloating. If you had a lower endoscopy (such as a colonoscopy or flexible sigmoidoscopy) you may notice spotting of blood in your stool or on the toilet paper. If you underwent a bowel prep for your procedure, you may not have a normal bowel movement for a few days.  Please Note:  You might notice some irritation and congestion in your nose or some drainage.  This is from the oxygen used during your procedure.  There is no need for concern and it should clear up in a day or so.  SYMPTOMS TO REPORT IMMEDIATELY:  Following lower endoscopy (colonoscopy or flexible sigmoidoscopy):  Excessive amounts of blood in the stool  Significant tenderness or worsening of abdominal pains  Swelling of the abdomen that is new, acute  Fever of 100F or higher   For urgent or emergent issues, a gastroenterologist can be reached at any hour by calling [phone]. Do not use MyChart messaging for urgent concerns.    DIET:  We do recommend a small meal at first, but then you may proceed to your regular diet.  Drink plenty of fluids but you should avoid alcoholic beverages for 24 hours.  MEDICATIONS: Continue present medications.  Please see handouts given to you by your recovery nurse.  Thank you for allowing [place] to  provide for your healthcare needs today.  ACTIVITY:  You should plan to take it easy for the rest of today and you should NOT DRIVE or use heavy machinery until tomorrow (because of the sedation medicines used during the test).    FOLLOW UP: Our staff will call the number listed on your records the next business day following your procedure.  We will call around 7:15- 8:00 am to check on you and address any questions or concerns that you may have regarding the information given to you following your procedure. If we do not reach you, we will leave a message.  If you develop any symptoms (ie: fever, flu-like symptoms, shortness of breath, cough etc.) before then, please call [phone].  If you test positive for Covid 19 in the 2 weeks post procedure, please call and report this information to [place].    If any biopsies were taken you will be contacted by phone or by letter within the next 1-3 weeks.  Please call us at [phone] if you have not heard about the biopsies in 3 weeks.    SIGNATURES/CONFIDENTIALITY: You and/or your care partner have signed paperwork which will be entered into your electronic medical record.  These signatures attest to the fact that that the information above on your After Visit Summary has been reviewed and is understood.  Full responsibility of the confidentiality of this discharge information lies with you and/or your care-partner.

## 2021-08-02 NOTE — Progress Notes (Unsigned)
History and Physical Interval Note:  08/02/2021 10:45 AM  Curtis Butler  has presented today for endoscopic procedure(s), with the diagnosis of  Encounter Diagnosis  Name Primary?   Colon cancer screening Yes  .  The various methods of evaluation and treatment have been discussed with the patient and/or family. After consideration of risks, benefits and other options for treatment, the patient has consented to  the endoscopic procedure(s).   The patient's history has been reviewed, patient examined, no change in status, stable for endoscopic procedure(s).  I have reviewed the patient's chart and labs.  Questions were answered to the patient's satisfaction.     Immanuel Fedak E. Candis Schatz, MD [facility]

## 2021-08-03 ENCOUNTER — Telehealth: Payer: Self-pay

## 2021-08-03 NOTE — Telephone Encounter (Signed)
  Follow up Call-     08/02/2021    9:51 AM  Call back number  Post procedure Call Back phone  # [phone]  Permission to leave phone message Yes     Patient questions:  Do you have a fever, pain , or abdominal swelling? No. Pain Score  0 *  Have you tolerated food without any problems? Yes.    Have you been able to return to your normal activities? Yes.    Do you have any questions about your discharge instructions: Diet   No. Medications  No. Follow up visit  No.  Do you have questions or concerns about your Care? No.  Actions: * If pain score is 4 or above: No action needed, pain <4.

## 2021-08-10 NOTE — Progress Notes (Signed)
Mr. Burruss,  Good news: the polyp (or polyps) that I removed during your recent examination were NOT precancerous.  You should continue to follow current colorectal cancer screening guidelines with a repeat colonoscopy in 10 years.    If you develop any new rectal bleeding, abdominal pain or significant bowel habit changes, please contact me before then.

## 2021-09-25 ENCOUNTER — Encounter: Payer: Self-pay | Admitting: *Deleted

## 2021-12-14 ENCOUNTER — Encounter: Payer: Self-pay | Admitting: *Deleted

## 2022-02-06 ENCOUNTER — Encounter: Payer: Self-pay | Admitting: Family Medicine

## 2022-03-04 ENCOUNTER — Other Ambulatory Visit: Payer: Self-pay | Admitting: Family Medicine

## 2022-06-08 ENCOUNTER — Ambulatory Visit (INDEPENDENT_AMBULATORY_CARE_PROVIDER_SITE_OTHER): Payer: BC Managed Care – PPO | Admitting: Family Medicine

## 2022-06-08 ENCOUNTER — Encounter: Payer: Self-pay | Admitting: Family Medicine

## 2022-06-08 VITALS — BP 110/80 | HR 64 | Temp 97.6°F | Ht 67.0 in | Wt 173.2 lb

## 2022-06-08 DIAGNOSIS — Z87891 Personal history of nicotine dependence: Secondary | ICD-10-CM | POA: Diagnosis not present

## 2022-06-08 DIAGNOSIS — Z125 Encounter for screening for malignant neoplasm of prostate: Secondary | ICD-10-CM | POA: Diagnosis not present

## 2022-06-08 DIAGNOSIS — E559 Vitamin D deficiency, unspecified: Secondary | ICD-10-CM | POA: Diagnosis not present

## 2022-06-08 DIAGNOSIS — Z0000 Encounter for general adult medical examination without abnormal findings: Secondary | ICD-10-CM | POA: Diagnosis not present

## 2022-06-08 DIAGNOSIS — E785 Hyperlipidemia, unspecified: Secondary | ICD-10-CM | POA: Diagnosis not present

## 2022-06-08 DIAGNOSIS — Z131 Encounter for screening for diabetes mellitus: Secondary | ICD-10-CM | POA: Diagnosis not present

## 2022-06-08 DIAGNOSIS — R739 Hyperglycemia, unspecified: Secondary | ICD-10-CM | POA: Diagnosis not present

## 2022-06-08 LAB — COMPREHENSIVE METABOLIC PANEL
ALT: 17 U/L (ref 0–53)
AST: 16 U/L (ref 0–37)
Albumin: 4.5 g/dL (ref 3.5–5.2)
Alkaline Phosphatase: 67 U/L (ref 39–117)
BUN: 18 mg/dL (ref 6–23)
CO2: 28 mEq/L (ref 19–32)
Calcium: 10 mg/dL (ref 8.4–10.5)
Chloride: 104 mEq/L (ref 96–112)
Creatinine, Ser: 1.07 mg/dL (ref 0.40–1.50)
GFR: 74.83 mL/min (ref >60.00)
Glucose, Bld: 104 mg/dL — ABNORMAL HIGH (ref 70–99)
Potassium: 5.2 mEq/L — ABNORMAL HIGH (ref 3.5–5.1)
Sodium: 140 mEq/L (ref 135–145)
Total Bilirubin: 0.7 mg/dL (ref 0.2–1.2)
Total Protein: 7.2 g/dL (ref 6.0–8.3)

## 2022-06-08 LAB — URINALYSIS, ROUTINE W REFLEX MICROSCOPIC
Bilirubin Urine: NEGATIVE
Hgb urine dipstick: NEGATIVE
Ketones, ur: NEGATIVE
Leukocytes,Ua: NEGATIVE
Nitrite: NEGATIVE
RBC / HPF: NONE SEEN (ref 0–?)
Specific Gravity, Urine: 1.025 (ref 1.000–1.030)
Total Protein, Urine: NEGATIVE
Urine Glucose: NEGATIVE
Urobilinogen, UA: 0.2 (ref 0.0–1.0)
WBC, UA: NONE SEEN (ref 0–?)
pH: 6 (ref 5.0–8.0)

## 2022-06-08 LAB — VITAMIN D 25 HYDROXY (VIT D DEFICIENCY, FRACTURES): VITD: 34.06 ng/mL (ref 30.00–100.00)

## 2022-06-08 LAB — CBC WITH DIFFERENTIAL/PLATELET
Basophils Absolute: 0 10*3/uL (ref 0.0–0.1)
Basophils Relative: 0.6 % (ref 0.0–3.0)
Eosinophils Absolute: 0.1 10*3/uL (ref 0.0–0.7)
Eosinophils Relative: 2.6 % (ref 0.0–5.0)
HCT: 49.6 % (ref 39.0–52.0)
Hemoglobin: 16.6 g/dL (ref 13.0–17.0)
Lymphocytes Relative: 41.5 % (ref 12.0–46.0)
Lymphs Abs: 2.1 10*3/uL (ref 0.7–4.0)
MCHC: 33.5 g/dL (ref 30.0–36.0)
MCV: 89.1 fl (ref 78.0–100.0)
Monocytes Absolute: 0.5 10*3/uL (ref 0.1–1.0)
Monocytes Relative: 9.9 % (ref 3.0–12.0)
Neutro Abs: 2.3 10*3/uL (ref 1.4–7.7)
Neutrophils Relative %: 45.4 % (ref 43.0–77.0)
Platelets: 244 10*3/uL (ref 150.0–400.0)
RBC: 5.57 Mil/uL (ref 4.22–5.81)
RDW: 13.3 % (ref 11.5–15.5)
WBC: 5 10*3/uL (ref 4.0–10.5)

## 2022-06-08 LAB — HEMOGLOBIN A1C: Hgb A1c MFr Bld: 6 % (ref 4.6–6.5)

## 2022-06-08 LAB — LIPID PANEL
Cholesterol: 171 mg/dL (ref 0–200)
HDL: 45.2 mg/dL (ref >39.00)
LDL Cholesterol: 96 mg/dL (ref 0–99)
NonHDL: 126.17
Total CHOL/HDL Ratio: 4
Triglycerides: 152 mg/dL — ABNORMAL HIGH (ref 0.0–149.0)
VLDL: 30.4 mg/dL (ref 0.0–40.0)

## 2022-06-08 LAB — PSA: PSA: 1.24 ng/mL (ref 0.10–4.00)

## 2022-06-08 NOTE — Patient Instructions (Addendum)
If you can find the date of shingles shot please send to [place]- if not at least we know you had it  We will call you within two weeks about your referral to CT calcium/cardiac scoring through [facility].  Their phone number is [phone].  Please call them if you have not heard in 1-2 weeks   Please stop by lab before you go If you have mychart- we will send your results within 3 business days of [place] receiving them.  If you do not have mychart- we will call you about results within 5 business days of [place] receiving them.  *please also note that you will see labs on mychart as soon as they post. I will later go in and write notes on them- will say "notes from Dr. Durene Cal"   Recommended follow up: Return in about 1 year (around 06/08/2023) for physical or sooner if needed.Schedule b4 you leave.

## 2022-06-08 NOTE — Progress Notes (Signed)
Phone: [phone]   Subjective:  Patient presents today for their annual physical. Chief complaint-noted.   See problem oriented charting- ROS- full  review of systems was completed and negative  Per full ROS sheet completed by patient- more stable chronic issues  The following were reviewed and entered/updated in epic: Past Medical History:  Diagnosis Date   Allergy    GERD (gastroesophageal reflux disease)    prilosec otc sparingly   Hyperlipidemia    pravastatin 40mg    Low back pain    left low back into left leg. slipped disc he believes at L3-L4   Lumbar foraminal stenosis    Bilateral   Protrusion of lumbar intervertebral disc    Broad-based at L5-S1.   Spondylolisthesis, lumbar region    lytic at L4-L5.   Patient Active Problem List   Diagnosis Date Noted   Former smoker 10/31/2016    Priority: High   S/P lumbar fusion 10/29/2018    Priority: Medium    Hyperglycemia 11/05/2016    Priority: Medium    Low back pain     Priority: Medium    Hyperlipidemia     Priority: Medium    Vitamin D deficiency 10/31/2016    Priority: Low   Allergic rhinitis 10/31/2016    Priority: Low   GERD (gastroesophageal reflux disease)     Priority: Low   History of colon polyps 10/31/2016   Past Surgical History:  Procedure Laterality Date   ABDOMINAL EXPOSURE N/A 10/29/2018   Procedure: ABDOMINAL EXPOSURE;  Surgeon: Larina Earthly, MD;  Location: [facility] OR;  Service: Vascular;  Laterality: N/A;   ANTERIOR LUMBAR FUSION N/A 10/29/2018   Procedure: Anterior Lumbar Interbody Fusion  - Lumbar four-Lumbar five, posterior instrumented fusion Lumbar four-five;  Surgeon: Tia Alert, MD;  Location: [facility] OR;  Service: Neurosurgery;  Laterality: N/A;   LAMINECTOMY WITH POSTERIOR LATERAL ARTHRODESIS LEVEL 1 Left 10/29/2018   Procedure: Left Lumbar five-Sacral one hemilaminectomy; Posterior Instrumented Fusion Lumbar four-five;  Surgeon: Tia Alert, MD;  Location: [facility] OR;  Service:  Neurosurgery;  Laterality: Left;   SPINE SURGERY  10/04/2018   VASECTOMY      Family History  Problem Relation Age of Onset   Dementia Mother        18 in 2018- at friends home   Hypertension Mother    Heart disease Father        MI at 65 and killed him. didnt get medical care, cigar smoker   Healthy Sister    Cancer Maternal Grandmother        unknown   Bone cancer Paternal Grandfather        unknown original source   Healthy Daughter    Healthy Son    Rectal cancer Neg Hx    Colon cancer Neg Hx    Stomach cancer Neg Hx    Esophageal cancer Neg Hx     Medications- reviewed and updated Current Outpatient Medications  Medication Sig Dispense Refill   Coenzyme Q10 (COQ10) 100 MG CAPS      hydrocortisone 2.5 % ointment Apply topically 2 (two) times daily. For 1 week 30 g 0   Magnesium 250 MG TABS      Multiple Vitamin (MULTI-VITAMIN PO) Take by mouth. SUPER C     Omega-3 Fatty Acids (FISH OIL) 1000 MG CAPS      pravastatin (PRAVACHOL) 40 MG tablet TAKE 1 TABLET BY MOUTH EVERY DAY 90 tablet 3   No current facility-administered medications for  this visit.    Allergies-reviewed and updated Allergies  Allergen Reactions   Penicillins Rash    Did it involve swelling of the face/tongue/throat, SOB, or low BP? No Did it involve sudden or severe rash/hives, skin peeling, or any reaction on the inside of your mouth or nose? Unknown Did you need to seek medical attention at a hospital or doctor's office? Unknown When did it last happen? Childhood reaction.       If all above answers are "NO", may proceed with cephalosporin use.     Social History   Social History Narrative   Married 1989. 2 children- 24 son Risk analyst and 21 daughter at [employer]- both in [place] now in 2024.    Moved from [place] sept 2017.       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 Kirtland Bouchard, biking, walking trails, yoga    Objective  Objective:  BP 110/80   Pulse 64   Temp 97.6 F (36.4 C)   Ht 5\' 7"  (1.702 m)   Wt 173 lb 3.2 oz (78.6 kg)   SpO2 96%   BMI 27.13 kg/m  Gen: NAD, resting comfortably HEENT: Mucous membranes are moist. Oropharynx normal Neck: no thyromegaly CV: RRR no murmurs rubs or gallops Lungs: CTAB no crackles, wheeze, rhonchi Abdomen: soft/nontender/nondistended/normal bowel sounds. No rebound or guarding.  Ext: no edema Skin: warm, dry Neuro: grossly normal, moves all extremities, PERRLA   Assessment and Plan  62 y.o. Butler presenting for annual physical.  Health Maintenance counseling: 1. Anticipatory guidance: Patient counseled regarding regular dental exams -q6 months, eye exams -yearly,  avoiding smoking and second hand smoke, limiting alcohol to 2 beverages per day - about 5 total a week, no illicit drugs .   2. Risk factor reduction:  Advised patient of need for regular exercise and diet rich and fruits and vegetables to reduce risk of heart attack and stroke.  Exercise- 4 days a week last year- at about 3 right now with travel schedule with work- putting a cramp in his regimen- golf  with cart and pickle ball. Active otherwise Diet/weight management-weight up 5 lbs from last year- has wanted to stay under 165 on home scales- just above that around 169 at home- travel eating likely affecting this- wants to work this back down over summer.  Wt Readings from Last 3 Encounters:  06/08/22 173 lb 3.2 oz (78.6 kg)  08/02/21 166 lb (75.3 kg)  07/28/21 166 lb 2 oz (75.4 kg)  3. Immunizations/screenings/ancillary studies- final shingrix - If you can find the date of shingles shot please send to [place]- if not at least we know you had it. Opts out of COVID shots at this point Immunization History  Administered Date(s) Administered   Influenza,inj,Quad PF,6+ Mos 09/17/2018, 12/01/2019   PFIZER(Purple Top)SARS-COV-2 Vaccination 03/25/2019, 04/22/2019, 12/01/2019   Tdap 04/08/2017    Zoster Recombinat (Shingrix) 09/17/2018, 11/20/2018  4. Prostate cancer screening-  trend PSA with labs- had been told bph years ago Lab Results  Component Value Date   PSA 1.33 06/01/2021   PSA 1.02 04/08/2017   5. Colon cancer screening - last year referred to GI given rectal bleeding though suspect hemorrhoid related- 08/02/21 with 10 year repeat- they also thought hemorrhoid related 6. Skin cancer screening- no dermatologist. advised regular sunscreen use. Denies worrisome, changing, or new skin lesions- other than spot on central back wife wanted looked at last year-  have encouraged monitoring of this but no reported change- 1-2 mm circular brown lesion* 7. Smoking associated screening (lung cancer screening, AAA screen 65-75, UA)- former smoker- under 10 pack years quit 2018- get urinalysis only . Abdominal aortic aneurysm at 65  8. STD screening - only active with wife  Status of chronic or acute concerns   # left trigger finger - ring finger- last year was over 6 months- splint at night somewhat helpful then- today reports occasional issues but much better  #hyperlipidemia S: Medication: pravastatin 40 mg, also fish oil  Lab Results  Component Value Date   CHOL 176 06/01/2021   HDL 50.50 06/01/2021   LDLCALC 93 03/11/2020   LDLDIRECT 99.0 06/01/2021   TRIG 204.0 (H) 06/01/2021   CHOLHDL 3 06/01/2021   A/P: hopefully stable- update lipid panel today. Continue current meds for now  - with prediabetes prefer not to push the dose unless we have to - opted for CT calcium scoring for more information  #Vitamin D deficiency S: Medication:  takes OTC (available over the counter without a prescription) D - in his super C - has 1000 units Last vitamin D Lab Results  Component Value Date   VD25OH 35.48 06/01/2021  A/P: hopefully stable- update vitamin D today. Continue current meds for now    # Hyperglycemia/insulin resistance/prediabetes S:  Medication: none Lab Results  Component  Value Date   HGBA1C 5.8 06/01/2021   HGBA1C 5.5 03/11/2020   HGBA1C 5.7 04/08/2017   A/P: hopefully stable- update a1c today. Continue without meds for now   #back surgery 10/2018 REALLY helped- fusion l4-l5 - still doing well  Recommended follow up: Return in about 1 year (around 06/08/2023) for physical or sooner if needed.Schedule b4 you leave.  Lab/Order associations: fasting   ICD-10-CM   1. Preventative health care  Z00.00     2. Hyperlipidemia, unspecified hyperlipidemia type  E78.5 Comprehensive metabolic panel    CBC with Differential/Platelet    Lipid panel    Urinalysis, Routine w reflex microscopic    CT CARDIAC SCORING ([facility] LOCATIONS ONLY)    3. Hyperglycemia  R73.9 Hemoglobin A1c    4. Former smoker  Z87.891 Urinalysis, Routine w reflex microscopic    5. Vitamin D deficiency  E55.9 VITAMIN D 25 Hydroxy (Vit-D Deficiency, Fractures)    6. Screening for diabetes mellitus  Z13.1 Hemoglobin A1c    7. Screening for prostate cancer  Z12.5 PSA      No orders of the defined types were placed in this encounter.   Return precautions advised.  Tana Conch, MD

## 2022-07-20 ENCOUNTER — Ambulatory Visit
Admission: RE | Admit: 2022-07-20 | Discharge: 2022-07-20 | Disposition: A | Discharge status: Home / Self Care | Payer: BC Managed Care – PPO | Source: Ambulatory Visit | Attending: Family Medicine | Admitting: Family Medicine

## 2022-07-20 DIAGNOSIS — E785 Hyperlipidemia, unspecified: Secondary | ICD-10-CM

## 2022-07-20 DIAGNOSIS — I2510 Atherosclerotic heart disease of native coronary artery without angina pectoris: Secondary | ICD-10-CM | POA: Diagnosis not present

## 2022-07-26 ENCOUNTER — Encounter: Payer: Self-pay | Admitting: Family Medicine

## 2022-10-28 ENCOUNTER — Encounter: Payer: Self-pay | Admitting: Family Medicine

## 2023-02-01 ENCOUNTER — Ambulatory Visit (INDEPENDENT_AMBULATORY_CARE_PROVIDER_SITE_OTHER): Payer: BC Managed Care – PPO | Admitting: Family Medicine

## 2023-02-01 ENCOUNTER — Encounter: Payer: Self-pay | Admitting: Family Medicine

## 2023-02-01 VITALS — BP 100/70 | HR 62 | Temp 98.4°F | Ht 67.0 in | Wt 156.2 lb

## 2023-02-01 DIAGNOSIS — R931 Abnormal findings on diagnostic imaging of heart and coronary circulation: Secondary | ICD-10-CM | POA: Diagnosis not present

## 2023-02-01 DIAGNOSIS — R739 Hyperglycemia, unspecified: Secondary | ICD-10-CM

## 2023-02-01 DIAGNOSIS — E785 Hyperlipidemia, unspecified: Secondary | ICD-10-CM | POA: Diagnosis not present

## 2023-02-01 DIAGNOSIS — S46812A Strain of other muscles, fascia and tendons at shoulder and upper arm level, left arm, initial encounter: Secondary | ICD-10-CM

## 2023-02-01 DIAGNOSIS — I700 Atherosclerosis of aorta: Secondary | ICD-10-CM | POA: Insufficient documentation

## 2023-02-01 DIAGNOSIS — Z131 Encounter for screening for diabetes mellitus: Secondary | ICD-10-CM

## 2023-02-01 LAB — LIPID PANEL
Cholesterol: 174 mg/dL (ref 0–200)
HDL: 51.4 mg/dL (ref >39.00)
LDL Cholesterol: 101 mg/dL — ABNORMAL HIGH (ref 0–99)
NonHDL: 122.85
Total CHOL/HDL Ratio: 3
Triglycerides: 110 mg/dL (ref 0.0–149.0)
VLDL: 22 mg/dL (ref 0.0–40.0)

## 2023-02-01 LAB — COMPREHENSIVE METABOLIC PANEL
ALT: 17 U/L (ref 0–53)
AST: 17 U/L (ref 0–37)
Albumin: 4.8 g/dL (ref 3.5–5.2)
Alkaline Phosphatase: 80 U/L (ref 39–117)
BUN: 19 mg/dL (ref 6–23)
CO2: 29 meq/L (ref 19–32)
Calcium: 9.6 mg/dL (ref 8.4–10.5)
Chloride: 105 meq/L (ref 96–112)
Creatinine, Ser: 1.04 mg/dL (ref 0.40–1.50)
GFR: 77.07 mL/min (ref >60.00)
Glucose, Bld: 100 mg/dL — ABNORMAL HIGH (ref 70–99)
Potassium: 4.9 meq/L (ref 3.5–5.1)
Sodium: 140 meq/L (ref 135–145)
Total Bilirubin: 0.8 mg/dL (ref 0.2–1.2)
Total Protein: 7.7 g/dL (ref 6.0–8.3)

## 2023-02-01 LAB — HEMOGLOBIN A1C: Hgb A1c MFr Bld: 5.9 % (ref 4.6–6.5)

## 2023-02-01 NOTE — Patient Instructions (Addendum)
Please stop by lab before you go If you have mychart- we will send your results within 3 business days of [place] receiving them.  If you do not have mychart- we will call you about results within 5 business days of [place] receiving them.  *please also note that you will see labs on mychart as soon as they post. I will later go in and write notes on them- will say "notes from Dr. Durene Cal"   Call sports medicine if you do not hear within a week- # listed below   Recommended follow up: Return for next already scheduled visit or sooner if needed.

## 2023-02-01 NOTE — Progress Notes (Signed)
Phone [phone] In person visit   Subjective:   Curtis Butler is a 63 y.o. year old very pleasant male patient who presents for/with See problem oriented charting Chief Complaint  Patient presents with   Medical Management of Chronic Issues   Hyperlipidemia   Neck Pain    Pt c/o chornic neck pain x 6 months.    Past Medical History-  Patient Active Problem List   Diagnosis Date Noted   Agatston coronary artery calcium score between 100 and 199 02/01/2023    Priority: High   Former smoker 10/31/2016    Priority: High   Aortic atherosclerosis (HCC) 02/01/2023    Priority: Medium    S/P lumbar fusion 10/29/2018    Priority: Medium    Hyperglycemia 11/05/2016    Priority: Medium    Low back pain     Priority: Medium    Hyperlipidemia     Priority: Medium    Vitamin D deficiency 10/31/2016    Priority: Low   Allergic rhinitis 10/31/2016    Priority: Low   History of colon polyps 10/31/2016    Priority: Low    Medications- reviewed and updated Current Outpatient Medications  Medication Sig Dispense Refill   aspirin EC 81 MG tablet Take 81 mg by mouth daily. Swallow whole.     Coenzyme Q10 (COQ10) 100 MG CAPS      Magnesium 250 MG TABS      Multiple Vitamin (MULTI-VITAMIN PO) Take by mouth. SUPER C     Omega-3 Fatty Acids (FISH OIL) 1000 MG CAPS      pravastatin (PRAVACHOL) 40 MG tablet TAKE 1 TABLET BY MOUTH EVERY DAY 90 tablet 3   No current facility-administered medications for this visit.     Objective:  BP 100/70   Pulse 62   Temp 98.4 F (36.9 C)   Ht 5\' 7"  (1.702 m)   Wt 156 lb 3.2 oz (70.9 kg)   SpO2 98%   BMI 24.46 kg/m  Gen: NAD, resting comfortably Left trapezius is tight on exam and slightly tender- good range of motion neck. Good strength in arms and shoulders CV: RRR no murmurs rubs or gallops Lungs: CTAB no crackles, wheeze, rhonchi Ext: no edema Skin: warm, dry     Assessment and Plan    # Left trapezius Pain S:reports 6  months of issues.  Has tried heat and massage. Very tight in that area- gets better short term. Carries back pack just on right side- has started putting . No left arm pain or numbness. Not worse at the gym. No weakness or paresthesias A/P: with duration of pain recommend sports medicine evaluation- referral placed today  # Coronary artery calcium score of 138 which is 70th percentile July 20, 2022 # Aortic atherosclerosis #hyperlipidemia S: Medication: on pravastatin 40 mg with LDL under 100 before CT calcium scoring and wanting to work on lifestyle - down 17 lbs from June- has made tremendous efforts on watching calorie intake and macros- usually under 1500 calories.  Lab Results  Component Value Date   CHOL 171 06/08/2022   HDL 45.20 06/08/2022   LDLCALC 96 06/08/2022   LDLDIRECT 99.0 06/01/2021   TRIG 152.0 (H) 06/08/2022   CHOLHDL 4 06/08/2022    A/P: Hyperlipidemia with LDL above 70 with ideal goal being under 70 with aortic atherosclerosis and coronary artery calcium mentation-previously discussed changing to rosuvastatin but he wanted to work on lifestyle-we will update lipid panel today to reassess -We went over algorithm  and cardiology has provided [place] and also recommended adding aspirin 81 mg (he agrees) and considering referral to cardiology-holding off for now  -He is also cautious about increasing strength of statin with prediabetes risk  -we discussed possible referral to nutrition- he is really dialed in on this and doing a great job but wants to maximize but he has a free option [facility] through work and is going of the pursue that first.     # Hyperglycemia/insulin resistance/prediabetes-with peak A1c 6.0 S:  Medication: none Exercise and diet- diet excellent as above. Still exercising regularly- even with travel getting on treadmill- easily 4 days a week consistently  Lab Results  Component Value Date   HGBA1C 6.0 06/08/2022   HGBA1C 5.8 06/01/2021   HGBA1C 5.5  03/11/2020   A/P: hopefully stable or rather improved-  update a1c today. Continue without meds for now     Recommended follow up: Return for next already scheduled visit or sooner if needed. Future Appointments  Date Time Provider Department Center  06/13/2023  8:00 AM Shelva Majestic, MD [facility]    Lab/Order associations: fasting   ICD-10-CM   1. Hyperlipidemia, unspecified hyperlipidemia type  E78.5 Comprehensive metabolic panel    Lipid panel    Lipoprotein A (LPA)    2. Aortic atherosclerosis (HCC)  I70.0     3. Agatston coronary artery calcium score between 100 and 199  R93.1     4. Hyperglycemia  R73.9 Hemoglobin A1c    5. Screening for diabetes mellitus  Z13.1 Hemoglobin A1c    6. Trapezius muscle strain, left, initial encounter  [phone] Ambulatory referral to Sports Medicine      No orders of the defined types were placed in this encounter.   Return precautions advised.  Tana Conch, MD

## 2023-02-05 ENCOUNTER — Encounter: Payer: Self-pay | Admitting: Family Medicine

## 2023-02-05 LAB — LIPOPROTEIN A (LPA): Lipoprotein (a): 22 nmol/L (ref <75)

## 2023-02-15 NOTE — Progress Notes (Signed)
 Aleen Sells D.[facility] [address] [place] [address] Phone: [phone]   Assessment and Plan:     1. Strain of left trapezius muscle, initial encounter -Chronic with exacerbation, initial sports medicine visit - Most consistent with left trapezius strain likely from physical activity, travel, backpack strap resting over painful area - No red flag symptoms, so no imaging at today's visit - Start meloxicam 15 mg daily x2 weeks.  If still having pain after 2 weeks, complete 3rd-week of NSAID. May use remaining NSAID as needed once daily for pain control.  Do not to use additional over-the-counter NSAIDs (ibuprofen, naproxen, Advil, Aleve) while taking prescription NSAIDs.  May use Tylenol [phone] mg 2 to 3 times a day for breakthrough pain. - Start HEP targeting trapezius  15 additional minutes spent for educating Therapeutic Home Exercise Program.  This included exercises focusing on stretching, strengthening, with focus on eccentric aspects.   Long term goals include an improvement in range of motion, strength, endurance as well as avoiding reinjury. Patient's frequency would include in 1-2 times a day, 3-5 times a week for a duration of 6-12 weeks. Proper technique shown and discussed handout in great detail with ATC.  All questions were discussed and answered.     Pertinent previous records reviewed include none  Follow Up: 4 weeks for reevaluation.  If no improvement or worsening of symptoms, could consider trigger point injections versus physical therapy   Subjective:   I, Curtis Butler, am serving as a Neurosurgeon for Doctor Richardean Sale  Chief Complaint: upper trap pain   HPI:   02/18/2023 Patient is a 63 year old male with upper trap pain. Patient states he has had upper trap pain for about year. No MOI. Left sided pain. Pain radiates up his neck . Decreased ROM. No meds for the pain.no numbness or tingling. Travels for work, and  uses a backpack brief case   Relevant Historical Information: None pertinent  Additional pertinent review of systems negative.   Current Outpatient Medications:    aspirin EC 81 MG tablet, Take 81 mg by mouth daily. Swallow whole., Disp: , Rfl:    Coenzyme Q10 (COQ10) 100 MG CAPS, , Disp: , Rfl:    Magnesium 250 MG TABS, , Disp: , Rfl:    meloxicam (MOBIC) 15 MG tablet, Take 1 tablet (15 mg total) by mouth daily., Disp: 30 tablet, Rfl: 0   Multiple Vitamin (MULTI-VITAMIN PO), Take by mouth. SUPER C, Disp: , Rfl:    Omega-3 Fatty Acids (FISH OIL) 1000 MG CAPS, , Disp: , Rfl:    pravastatin (PRAVACHOL) 40 MG tablet, TAKE 1 TABLET BY MOUTH EVERY DAY, Disp: 90 tablet, Rfl: 3   Objective:     Vitals:   02/18/23 0805  BP: 120/84  Pulse: 69  SpO2: 98%  Weight: 159 lb (72.1 kg)  Height: 5\' 7"  (1.702 m)      Body mass index is 24.9 kg/m.    Physical Exam:    Neck Exam: Cervical Spine- Posture normal Skin- normal, intact  Neuro:  Strength-  Right Left   Deltoid (C5) 5/5 5/5  Bicep/Brachioradialis (C5/6) 5/5  5/5  Wrist Extension (C6) 5/5 5/5  Tricep (C7) 5/5 5/5  Wrist Flexion (C7) 5/5 5/5  Grip (C8) 5/5 5/5  Finger Abduction (T1) 5/5 5/5   Sensation: intact to light touch in upper extremities bilaterally  Spurling's:  negative bilaterally Neck ROM: Full active ROM, though increased tension over left  side of neck with left rotation TTP: Left trapezius NTTP: cervical spinous processes, cervical paraspinal, thoracic paraspinal, right trapezius    Electronically signed by:  Aleen Sells D.[facility] 8:27 AM 02/18/23

## 2023-02-18 ENCOUNTER — Ambulatory Visit (INDEPENDENT_AMBULATORY_CARE_PROVIDER_SITE_OTHER): Payer: BC Managed Care – PPO | Admitting: Sports Medicine

## 2023-02-18 VITALS — BP 120/84 | HR 69 | Ht 67.0 in | Wt 159.0 lb

## 2023-02-18 DIAGNOSIS — S46812A Strain of other muscles, fascia and tendons at shoulder and upper arm level, left arm, initial encounter: Secondary | ICD-10-CM | POA: Diagnosis not present

## 2023-02-18 MED ORDER — MELOXICAM 15 MG PO TABS: 15.0000 mg | ORAL_TABLET | Freq: Every day | ORAL | 0 refills | Status: DC

## 2023-02-18 NOTE — Patient Instructions (Signed)
 Trap HEP  - Start meloxicam 15 mg daily x2 weeks.  If still having pain after 2 weeks, complete 3rd-week of NSAID. May use remaining NSAID as needed once daily for pain control.  Do not to use additional over-the-counter NSAIDs (ibuprofen, naproxen, Advil, Aleve) while taking prescription NSAIDs.  May use Tylenol [phone] mg 2 to 3 times a day for breakthrough pain. 4 week follow up

## 2023-03-15 NOTE — Progress Notes (Signed)
    Curtis Butler D.[facility] [address] [place] [address] Phone: [phone]   Assessment and Plan:     1. Strain of left trapezius muscle, initial encounter - Chronic with exacerbation, subsequent visit - Overall moderate improvement in symptoms consistent with left trapezius strain from physical activity, travel, backpack strap resting over painful area since completing course of meloxicam, starting HEP - Complete meloxicam 15 mg daily and then discontinue medication - May start Tylenol as needed for day-to-day pain relief - Continue HEP   Pertinent previous records reviewed include none  Follow Up: As needed if no improvement or worsening of symptoms.  Could further discuss trigger point injections versus physical therapy referral   Subjective:   I, [name], am serving as a Neurosurgeon for Doctor Richardean Sale   Chief Complaint: upper trap pain    HPI:    02/18/2023 Patient is a 63 year old male with upper trap pain. Patient states he has had upper trap pain for about year. No MOI. Left sided pain. Pain radiates up his neck . Decreased ROM. No meds for the pain.no numbness or tingling. Travels for work, and uses a backpack brief case   03/18/2023 Patient states that he is good.    Relevant Historical Information: None pertinent  Additional pertinent review of systems negative.   Current Outpatient Medications:    aspirin EC 81 MG tablet, Take 81 mg by mouth daily. Swallow whole., Disp: , Rfl:    Coenzyme Q10 (COQ10) 100 MG CAPS, , Disp: , Rfl:    Magnesium 250 MG TABS, , Disp: , Rfl:    meloxicam (MOBIC) 15 MG tablet, Take 1 tablet (15 mg total) by mouth daily., Disp: 30 tablet, Rfl: 0   Multiple Vitamin (MULTI-VITAMIN PO), Take by mouth. SUPER C, Disp: , Rfl:    Omega-3 Fatty Acids (FISH OIL) 1000 MG CAPS, , Disp: , Rfl:    pravastatin (PRAVACHOL) 40 MG tablet, TAKE 1 TABLET BY MOUTH EVERY DAY, Disp: 90 tablet, Rfl: 3    Objective:     Vitals:   03/18/23 0755  BP: 130/84  Pulse: 64  SpO2: 100%  Weight: 160 lb (72.6 kg)  Height: 5\' 7"  (1.702 m)      Body mass index is 25.06 kg/m.    Physical Exam:    Neck Exam: Cervical Spine- Posture normal Skin- normal, intact   Neuro:  Strength-   Right Left  Deltoid (C5) 5/5 5/5 Bicep/Brachioradialis (C5/6) 5/5  5/5 Wrist Extension (C6) 5/5 5/5 Tricep (C7) 5/5 5/5 Wrist Flexion (C7) 5/5 5/5 Grip (C8) 5/5 5/5 Finger Abduction (T1) 5/5 5/5   Sensation: intact to light touch in upper extremities bilaterally   Spurling's:  negative bilaterally Neck ROM: Full active ROM, though increased tension over left side of neck with left rotation TTP: mild Left trapezius NTTP: cervical spinous processes, cervical paraspinal, thoracic paraspinal, right trapezius     Electronically signed by:  Curtis Butler [facility] 8:05 AM 03/18/23

## 2023-03-18 ENCOUNTER — Ambulatory Visit (INDEPENDENT_AMBULATORY_CARE_PROVIDER_SITE_OTHER): Payer: BC Managed Care – PPO | Admitting: Sports Medicine

## 2023-03-18 VITALS — BP 130/84 | HR 64 | Ht 67.0 in | Wt 160.0 lb

## 2023-03-18 DIAGNOSIS — S46812A Strain of other muscles, fascia and tendons at shoulder and upper arm level, left arm, initial encounter: Secondary | ICD-10-CM | POA: Diagnosis not present

## 2023-04-20 ENCOUNTER — Other Ambulatory Visit: Payer: Self-pay | Admitting: Family Medicine

## 2023-06-04 ENCOUNTER — Encounter: Payer: Self-pay | Admitting: Family Medicine

## 2023-06-13 ENCOUNTER — Encounter: Payer: BC Managed Care – PPO | Admitting: Family Medicine

## 2023-10-25 ENCOUNTER — Ambulatory Visit (INDEPENDENT_AMBULATORY_CARE_PROVIDER_SITE_OTHER): Admitting: Family Medicine

## 2023-10-25 ENCOUNTER — Encounter: Payer: Self-pay | Admitting: Family Medicine

## 2023-10-25 ENCOUNTER — Ambulatory Visit: Payer: Self-pay | Admitting: Family Medicine

## 2023-10-25 VITALS — BP 130/78 | HR 68 | Temp 97.8°F | Ht 67.0 in | Wt 156.6 lb

## 2023-10-25 DIAGNOSIS — Z87891 Personal history of nicotine dependence: Secondary | ICD-10-CM | POA: Diagnosis not present

## 2023-10-25 DIAGNOSIS — Z0000 Encounter for general adult medical examination without abnormal findings: Secondary | ICD-10-CM | POA: Diagnosis not present

## 2023-10-25 DIAGNOSIS — E785 Hyperlipidemia, unspecified: Secondary | ICD-10-CM

## 2023-10-25 DIAGNOSIS — Z125 Encounter for screening for malignant neoplasm of prostate: Secondary | ICD-10-CM

## 2023-10-25 DIAGNOSIS — R739 Hyperglycemia, unspecified: Secondary | ICD-10-CM

## 2023-10-25 DIAGNOSIS — E559 Vitamin D deficiency, unspecified: Secondary | ICD-10-CM

## 2023-10-25 DIAGNOSIS — Z131 Encounter for screening for diabetes mellitus: Secondary | ICD-10-CM

## 2023-10-25 LAB — CBC WITH DIFFERENTIAL/PLATELET
Basophils Absolute: 0 K/uL (ref 0.0–0.1)
Basophils Relative: 0.5 % (ref 0.0–3.0)
Eosinophils Absolute: 0.1 K/uL (ref 0.0–0.7)
Eosinophils Relative: 1.7 % (ref 0.0–5.0)
HCT: 48.7 % (ref 39.0–52.0)
Hemoglobin: 16.1 g/dL (ref 13.0–17.0)
Lymphocytes Relative: 31.5 % (ref 12.0–46.0)
Lymphs Abs: 1.6 K/uL (ref 0.7–4.0)
MCHC: 33 g/dL (ref 30.0–36.0)
MCV: 91.1 fl (ref 78.0–100.0)
Monocytes Absolute: 0.5 K/uL (ref 0.1–1.0)
Monocytes Relative: 9.7 % (ref 3.0–12.0)
Neutro Abs: 2.9 K/uL (ref 1.4–7.7)
Neutrophils Relative %: 56.6 % (ref 43.0–77.0)
Platelets: 244 K/uL (ref 150.0–400.0)
RBC: 5.35 Mil/uL (ref 4.22–5.81)
RDW: 13.7 % (ref 11.5–15.5)
WBC: 5.2 K/uL (ref 4.0–10.5)

## 2023-10-25 LAB — LIPID PANEL
Cholesterol: 202 mg/dL — ABNORMAL HIGH (ref 0–200)
HDL: 57.5 mg/dL (ref >39.00)
LDL Cholesterol: 114 mg/dL — ABNORMAL HIGH (ref 0–99)
NonHDL: 144.55
Total CHOL/HDL Ratio: 4
Triglycerides: 152 mg/dL — ABNORMAL HIGH (ref 0.0–149.0)
VLDL: 30.4 mg/dL (ref 0.0–40.0)

## 2023-10-25 LAB — URINALYSIS, ROUTINE W REFLEX MICROSCOPIC
Bilirubin Urine: NEGATIVE
Hgb urine dipstick: NEGATIVE
Ketones, ur: NEGATIVE
Leukocytes,Ua: NEGATIVE
Nitrite: NEGATIVE
Specific Gravity, Urine: 1.02 (ref 1.000–1.030)
Total Protein, Urine: NEGATIVE
Urine Glucose: NEGATIVE
Urobilinogen, UA: 0.2 (ref 0.0–1.0)
pH: 6 (ref 5.0–8.0)

## 2023-10-25 LAB — COMPREHENSIVE METABOLIC PANEL WITH GFR
ALT: 15 U/L (ref 0–53)
AST: 15 U/L (ref 0–37)
Albumin: 4.9 g/dL (ref 3.5–5.2)
Alkaline Phosphatase: 68 U/L (ref 39–117)
BUN: 21 mg/dL (ref 6–23)
CO2: 27 meq/L (ref 19–32)
Calcium: 9.9 mg/dL (ref 8.4–10.5)
Chloride: 102 meq/L (ref 96–112)
Creatinine, Ser: 1.01 mg/dL (ref 0.40–1.50)
GFR: 79.42 mL/min (ref >60.00)
Glucose, Bld: 105 mg/dL — ABNORMAL HIGH (ref 70–99)
Potassium: 4.3 meq/L (ref 3.5–5.1)
Sodium: 141 meq/L (ref 135–145)
Total Bilirubin: 1 mg/dL (ref 0.2–1.2)
Total Protein: 7.7 g/dL (ref 6.0–8.3)

## 2023-10-25 LAB — VITAMIN D 25 HYDROXY (VIT D DEFICIENCY, FRACTURES): VITD: 82.48 ng/mL (ref 30.00–100.00)

## 2023-10-25 LAB — HEMOGLOBIN A1C: Hgb A1c MFr Bld: 5.8 % (ref 4.6–6.5)

## 2023-10-25 LAB — PSA: PSA: 1.49 ng/mL (ref 0.10–4.00)

## 2023-10-25 NOTE — Progress Notes (Signed)
 Phone: [phone]   Subjective:  Patient presents today for their annual physical. Chief complaint-noted.   See problem oriented charting- ROS- full  review of systems was completed and negative  except for topics noted under acute/chronic concerns  The following were reviewed and entered/updated in epic: Past Medical History:  Diagnosis Date   Allergy    GERD (gastroesophageal reflux disease)    prilosec otc sparingly   Hyperlipidemia    pravastatin  40mg    Low back pain    left low back into left leg. slipped disc he believes at L3-L4   Lumbar foraminal stenosis    Bilateral   Protrusion of lumbar intervertebral disc    Broad-based at L5-S1.   Spondylolisthesis, lumbar region    lytic at L4-L5.   Patient Active Problem List   Diagnosis Date Noted   Agatston coronary artery calcium score between 100 and 199 02/01/2023    Priority: High   Former smoker 10/31/2016    Priority: High   Aortic atherosclerosis 02/01/2023    Priority: Medium    S/P lumbar fusion 10/29/2018    Priority: Medium    Hyperglycemia 11/05/2016    Priority: Medium    Low back pain     Priority: Medium    Hyperlipidemia     Priority: Medium    Vitamin D  deficiency 10/31/2016    Priority: Low   Allergic rhinitis 10/31/2016    Priority: Low   History of colon polyps 10/31/2016    Priority: Low   Past Surgical History:  Procedure Laterality Date   ABDOMINAL EXPOSURE N/A 10/29/2018   Procedure: ABDOMINAL EXPOSURE;  Surgeon: Oris Krystal FALCON, MD;  Location: [facility] OR;  Service: Vascular;  Laterality: N/A;   ANTERIOR LUMBAR FUSION N/A 10/29/2018   Procedure: Anterior Lumbar Interbody Fusion  - Lumbar four-Lumbar five, posterior instrumented fusion Lumbar four-five;  Surgeon: Joshua Alm RAMAN, MD;  Location: [facility] OR;  Service: Neurosurgery;  Laterality: N/A;   LAMINECTOMY WITH POSTERIOR LATERAL ARTHRODESIS LEVEL 1 Left 10/29/2018   Procedure: Left Lumbar five-Sacral one hemilaminectomy; Posterior  Instrumented Fusion Lumbar four-five;  Surgeon: Joshua Alm RAMAN, MD;  Location: [facility] OR;  Service: Neurosurgery;  Laterality: Left;   SPINE SURGERY  10/04/2018   VASECTOMY      Family History  Problem Relation Age of Onset   Dementia Mother        73 in 2018- at friends home   Hypertension Mother    Heart disease Father        MI at 20 and killed him. didnt get medical care, cigar smoker   Healthy Sister    Cancer Maternal Grandmother        unknown   Bone cancer Paternal Grandfather        unknown original source   Cancer Paternal Grandfather    Healthy Daughter    Healthy Son    Rectal cancer Neg Hx    Colon cancer Neg Hx    Stomach cancer Neg Hx    Esophageal cancer Neg Hx     Medications- reviewed and updated Current Outpatient Medications  Medication Sig Dispense Refill   aspirin EC 81 MG tablet Take 81 mg by mouth daily. Swallow whole.     Coenzyme Q10 (COQ10) 100 MG CAPS      Magnesium 250 MG TABS      Multiple Vitamin (MULTI-VITAMIN PO) Take by mouth. SUPER C     Omega-3 Fatty Acids (FISH OIL) 1000 MG CAPS  pravastatin  (PRAVACHOL ) 40 MG tablet TAKE 1 TABLET BY MOUTH EVERY DAY 90 tablet 3   No current facility-administered medications for this visit.    Allergies-reviewed and updated Allergies  Allergen Reactions   Penicillins Rash    Did it involve swelling of the face/tongue/throat, SOB, or low BP? No Did it involve sudden or severe rash/hives, skin peeling, or any reaction on the inside of your mouth or nose? Unknown Did you need to seek medical attention at a hospital or doctor's office? Unknown When did it last happen? Childhood reaction.       If all above answers are "NO", may proceed with cephalosporin use.     Social History   Social History Narrative   Married 1989. 2 children- 24 son Risk analyst and 21 daughter at [employer]- both in [place]  now in [place]24.    Moved from [place]  sept 2017.       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 MARLA, biking, walking trails, yoga   Objective  Objective:  BP 130/78 (BP Location: Right Arm, Patient Position: Sitting, Cuff Size: Normal)   Pulse 68   Temp 97.8 F (36.6 C) (Temporal)   Ht 5' 7 (1.702 m)   Wt 156 lb 9.6 oz (71 kg)   SpO2 98%   BMI 24.53 kg/m  Gen: NAD, resting comfortably HEENT: Mucous membranes are moist. Oropharynx normal Neck: no thyromegaly CV: RRR no murmurs rubs or gallops Lungs: CTAB no crackles, wheeze, rhonchi Abdomen: soft/nontender/nondistended/normal bowel sounds. No rebound or guarding.  Ext: no edema Skin: warm, dry Neuro: grossly normal, moves all extremities, PERRLA    Assessment and Plan  63 y.o. male presenting for annual physical.  Health Maintenance counseling: 1. Anticipatory guidance: Patient counseled regarding regular dental exams -q6 months, eye exams -every few years- no issues other than some changes with not needing readers- next week,  avoiding smoking and second hand smoke , limiting alcohol to 2 beverages per day - 5 a week still, no illicit drugs .   2. Risk factor reduction:  Advised patient of need for regular exercise and diet rich and fruits and vegetables to reduce risk of heart attack and stroke.  Exercise- walking 2 miles many days of the week and 4 on weekend- somewhat hard with travel with work- going to try to do treadmill. Wants to add some strength training  Diet/weight management-down 17 lbs. Tracking carbohydrates and sugars- does about 1500 calories- does protein shakes .  Wt Readings from Last 3 Encounters:  10/25/23 156 lb 9.6 oz (71 kg)  03/18/23 160 lb (72.6 kg)  02/18/23 159 lb (72.1 kg)  3. Immunizations/screenings/ancillary studies- declines flu and Prevnar for now  Immunization History  Administered Date(s) Administered   Influenza,inj,Quad PF,6+ Mos 09/17/2018, 12/01/2019   PFIZER(Purple Top)SARS-COV-2 Vaccination  03/25/2019, 04/22/2019, 12/01/2019   Tdap 04/08/2017   Zoster Recombinant(Shingrix) 09/17/2018, 11/20/2018  4. Prostate cancer screening-  low risk prior trend- update psa today   Lab Results  Component Value Date   PSA 1.24 06/08/2022   PSA 1.33 06/01/2021   PSA 1.02 04/08/2017   5. Colon cancer screening - 08/02/21 with 10 year repeat- had hemorrhoid relate rectal bleeding. No further issues lately with improved diet and weight loss 6. Skin cancer screening- no dermatologist. advised regular sunscreen use. Denies worrisome, changing, or new skin lesions.  7. Smoking associated screening (lung cancer screening, AAA screen  65-75, UA)- former smoker- quit 2018 and under 10 pack years. Urinalysis today. Abdominal aortic aneurysm screen at 65 8. STD screening - only active with wife  Status of chronic or acute concerns   back surgery 10/2018 REALLY helped- fusion l4-l5 - still holding up well. Even played 36 holes the other day and held up well other than some upper back soreness.   # Coronary artery calcium score of 138 which is 70th percentile July 20, 2022 # Aortic atherosclerosis #hyperlipidemia -lipoprotein a not elevated S: Medication: on pravastatin  40 mg with LDL under 100 before CT calcium scoring and wanting to work on lifestyle over stronger statin. Has improved diet- cutting down on carbohydrates and sugar -aspirin 81 mg -have offered cardiology consult Lab Results  Component Value Date   CHOL 174 02/01/2023   HDL 51.40 02/01/2023   LDLCALC 101 (H) 02/01/2023   LDLDIRECT 99.0 06/01/2021   TRIG 110.0 02/01/2023   CHOLHDL 3 02/01/2023  A/P: hopefully improved- update lipid panel and apoB today. Continue current meds for now     # Hyperglycemia/insulin resistance/prediabetes-with peak A1c 6.0 S:  Medication: none Lab Results  Component Value Date   HGBA1C 5.9 02/01/2023   HGBA1C 6.0 06/08/2022   HGBA1C 5.8 06/01/2021  A/P: hopefully stable- update a1c today. Continue  without meds for now   #Vitamin D  deficiency S: Medication: multivitamin but plus vitamin D  with K2  Last vitamin D  Lab Results  Component Value Date   VD25OH 34.06 06/08/2022  A/P: hopefully stable- update vitamin D  today. Continue current meds for now    Recommended follow up: Return in about 1 year (around 10/24/2024) for physical or sooner if needed.Schedule b4 you leave.  Lab/Order associations: fasting   ICD-10-CM   1. Preventative health care  Z00.00     2. Hyperlipidemia, unspecified hyperlipidemia type  E78.5     3. Screening for diabetes mellitus  Z13.1     4. Vitamin D  deficiency  E55.9     5. Screening for prostate cancer  Z12.5     6. Hyperglycemia  R73.9     7. Former smoker  Z87.891       No orders of the defined types were placed in this encounter.   Return precautions advised.  Garnette Lukes, MD

## 2023-10-25 NOTE — Patient Instructions (Addendum)
 Please stop by lab before you go If you have mychart- we will send your results within 3 business days of us  receiving them.  If you do not have mychart- we will call you about results within 5 business days of us  receiving them.  *please also note that you will see labs on mychart as soon as they post. I will later go in and write notes on them- will say notes from Dr. Katrinka   No changes today unless labs lead us  to make changes  Recommended follow up: Return in about 1 year (around 10/24/2024) for physical or sooner if needed.Schedule b4 you leave.

## 2023-10-26 LAB — APOLIPOPROTEIN B: Apolipoprotein B: 94 mg/dL — ABNORMAL HIGH (ref <90)
